# Patient Record
Sex: Female | Born: 1980
Health system: Southern US, Community
[De-identification: ages and names within clinical notes are randomized; demographics above are authoritative.]

## PROBLEM LIST (undated history)

## (undated) ENCOUNTER — Inpatient Hospital Stay (HOSPITAL_COMMUNITY): Payer: Self-pay

## (undated) DIAGNOSIS — Z3493 Encounter for supervision of normal pregnancy, unspecified, third trimester: Secondary | ICD-10-CM

## (undated) DIAGNOSIS — Z8719 Personal history of other diseases of the digestive system: Secondary | ICD-10-CM

## (undated) DIAGNOSIS — N809 Endometriosis, unspecified: Secondary | ICD-10-CM

## (undated) DIAGNOSIS — O039 Complete or unspecified spontaneous abortion without complication: Secondary | ICD-10-CM

## (undated) HISTORY — PX: CHOLECYSTECTOMY: SHX55

## (undated) HISTORY — PX: HERNIA REPAIR: SHX51

## (undated) HISTORY — PX: DILATION AND CURETTAGE OF UTERUS: SHX78

## (undated) HISTORY — PX: WISDOM TOOTH EXTRACTION: SHX21

---

## 2014-08-18 ENCOUNTER — Emergency Department (HOSPITAL_BASED_OUTPATIENT_CLINIC_OR_DEPARTMENT_OTHER): Payer: Medicaid Other

## 2014-08-18 ENCOUNTER — Encounter (HOSPITAL_BASED_OUTPATIENT_CLINIC_OR_DEPARTMENT_OTHER): Payer: Self-pay | Admitting: Emergency Medicine

## 2014-08-18 ENCOUNTER — Emergency Department (HOSPITAL_BASED_OUTPATIENT_CLINIC_OR_DEPARTMENT_OTHER)
Admission: EM | Admit: 2014-08-18 | Discharge: 2014-08-18 | Disposition: A | Discharge status: Home / Self Care | Payer: Medicaid Other | Attending: Emergency Medicine | Admitting: Emergency Medicine

## 2014-08-18 DIAGNOSIS — O039 Complete or unspecified spontaneous abortion without complication: Secondary | ICD-10-CM | POA: Insufficient documentation

## 2014-08-18 DIAGNOSIS — O9989 Other specified diseases and conditions complicating pregnancy, childbirth and the puerperium: Secondary | ICD-10-CM | POA: Insufficient documentation

## 2014-08-18 DIAGNOSIS — R5381 Other malaise: Secondary | ICD-10-CM | POA: Diagnosis not present

## 2014-08-18 DIAGNOSIS — R5383 Other fatigue: Secondary | ICD-10-CM

## 2014-08-18 DIAGNOSIS — R42 Dizziness and giddiness: Secondary | ICD-10-CM | POA: Diagnosis not present

## 2014-08-18 DIAGNOSIS — O209 Hemorrhage in early pregnancy, unspecified: Secondary | ICD-10-CM | POA: Diagnosis present

## 2014-08-18 LAB — CBC
HCT: 37.3 % (ref 36.0–46.0)
Hemoglobin: 12.6 g/dL (ref 12.0–15.0)
MCH: 27.7 pg (ref 26.0–34.0)
MCHC: 33.8 g/dL (ref 30.0–36.0)
MCV: 82 fL (ref 78.0–100.0)
Platelets: 270 10*3/uL (ref 150–400)
RBC: 4.55 MIL/uL (ref 3.87–5.11)
RDW: 14 % (ref 11.5–15.5)
WBC: 9.2 10*3/uL (ref 4.0–10.5)

## 2014-08-18 LAB — HCG, QUANTITATIVE, PREGNANCY: hCG, Beta Chain, Quant, S: 3469 m[IU]/mL — ABNORMAL HIGH (ref <5)

## 2014-08-18 NOTE — Discharge Instructions (Signed)
Miscarriage A miscarriage is the sudden loss of an unborn baby (fetus) before the 20th week of pregnancy. Most miscarriages happen in the first 3 months of pregnancy. Sometimes, it happens before a woman even knows she is pregnant. A miscarriage is also called a "spontaneous miscarriage" or "early pregnancy loss." Having a miscarriage can be an emotional experience. Talk with your caregiver about any questions you may have about miscarrying, the grieving process, and your future pregnancy plans. CAUSES   Problems with the fetal chromosomes that make it impossible for the baby to develop normally. Problems with the baby's genes or chromosomes are most often the result of errors that occur, by chance, as the embryo divides and grows. The problems are not inherited from the parents.  Infection of the cervix or uterus.   Hormone problems.   Problems with the cervix, such as having an incompetent cervix. This is when the tissue in the cervix is not strong enough to hold the pregnancy.   Problems with the uterus, such as an abnormally shaped uterus, uterine fibroids, or congenital abnormalities.   Certain medical conditions.   Smoking, drinking alcohol, or taking illegal drugs.   Trauma.  Often, the cause of a miscarriage is unknown.  SYMPTOMS   Vaginal bleeding or spotting, with or without cramps or pain.  Pain or cramping in the abdomen or lower back.  Passing fluid, tissue, or blood clots from the vagina. DIAGNOSIS  Your caregiver will perform a physical exam. You may also have an ultrasound to confirm the miscarriage. Blood or urine tests may also be ordered. TREATMENT   Sometimes, treatment is not necessary if you naturally pass all the fetal tissue that was in the uterus. If some of the fetus or placenta remains in the body (incomplete miscarriage), tissue left behind may become infected and must be removed. Usually, a dilation and curettage (D and C) procedure is performed.  During a D and C procedure, the cervix is widened (dilated) and any remaining fetal or placental tissue is gently removed from the uterus.  Antibiotic medicines are prescribed if there is an infection. Other medicines may be given to reduce the size of the uterus (contract) if there is a lot of bleeding.  If you have Rh negative blood and your baby was Rh positive, you will need a Rh immunoglobulin shot. This shot will protect any future baby from having Rh blood problems in future pregnancies. HOME CARE INSTRUCTIONS   Your caregiver may order bed rest or may allow you to continue light activity. Resume activity as directed by your caregiver.  Have someone help with home and family responsibilities during this time.   Keep track of the number of sanitary pads you use each day and how soaked (saturated) they are. Write down this information.   Do not use tampons. Do not douche or have sexual intercourse until approved by your caregiver.   Only take over-the-counter or prescription medicines for pain or discomfort as directed by your caregiver.   Do not take aspirin. Aspirin can cause bleeding.   Keep all follow-up appointments with your caregiver.   If you or your partner have problems with grieving, talk to your caregiver or seek counseling to help cope with the pregnancy loss. Allow enough time to grieve before trying to get pregnant again.  SEEK IMMEDIATE MEDICAL CARE IF:   You have severe cramps or pain in your back or abdomen.  You have a fever.  You pass large blood clots (walnut-sized   or larger) ortissue from your vagina. Save any tissue for your caregiver to inspect.   Your bleeding increases.   You have a thick, bad-smelling vaginal discharge.  You become lightheaded, weak, or you faint.   You have chills.  MAKE SURE YOU:  Understand these instructions.  Will watch your condition.  Will get help right away if you are not doing well or get  worse. Document Released: 06/01/2001 Document Revised: 04/02/2013 Document Reviewed: 01/25/2012 [employer] Patient Information 2015 [employer]. This information is not intended to replace advice given to you by your health care provider. Make sure you discuss any questions you have with your health care provider.  

## 2014-08-18 NOTE — ED Notes (Signed)
Reports vaginal bleeding since earlier. Reports some lower abdominal cramping/pain at this time. Reports one pad used since 11am.

## 2014-08-18 NOTE — ED Provider Notes (Signed)
CSN: [id number]     Arrival date & time 08/18/14  1620 History  This chart was scribed for Elwin Mocha, MD, by Yevette Edwards, ED Scribe. This patient was seen in room MH04/MH04 and the patient's care was started at 5:57 PM.   First MD Initiated Contact with Patient 08/18/14 1741     Chief Complaint  Patient presents with  . Vaginal Bleeding    Patient is a 33 y.o. female presenting with vaginal bleeding. The history is provided by the patient. No language interpreter was used.  Vaginal Bleeding Quality:  Clots Severity:  Moderate Onset quality:  Gradual Duration:  1 day Timing:  Intermittent Progression:  Worsening Chronicity:  Recurrent Number of pads used:  One Possible pregnancy: yes   Context: at rest, during urination and spontaneously   Relieved by:  Nothing Worsened by:  Nothing tried Ineffective treatments:  None tried Associated symptoms: abdominal pain and fatigue   Associated symptoms: no fever   Risk factors: prior miscarriage    HPI Comments: Lauren Drake is a 33 y.o. female, who reports Lauren Drake is [redacted] weeks pregnant, presenting to the Emergency Department complaining of intermittent vaginal bleeding which began today.  Her last incidence of vaginal bleeding was approximately four weeks ago; Lauren Drake bled for three days; Lauren Drake was treated in [place]'s ED. Lauren Drake reports the number and size of blood clots Lauren Drake is passing today are greater than when Lauren Drake was treated previously. Lauren Drake also reports that this is the third episode of spotting during the pregancy. Her OBGYN is in [facility]  her appointment is in September.  Lauren Drake also endorses intermittent lower abdominal pain which Lauren Drake characterizes as "like a mild contraction" and back pain. Lauren Drake also reports Lauren Drake experienced lightheadedness and fatigue yesterday; the symptoms have resolved today. This is her fourth pregnancy; Lauren Drake had two lives births but her second pregnancy ended spontaneously at 10 weeks. Lauren Drake reports Lauren Drake is not  RH negative and Lauren Drake denies a h/o Rhogam injection.   History reviewed. No pertinent past medical history. Past Surgical History  Procedure Laterality Date  . Hernia repair     No family history on file. History  Substance Use Topics  . Smoking status: Never Smoker   . Smokeless tobacco: Not on file  . Alcohol Use: No   OB History   Grav Para Term Preterm Abortions TAB SAB Ect Mult Living   1              Review of Systems  Constitutional: Positive for fatigue. Negative for fever.  Gastrointestinal: Positive for abdominal pain.  Genitourinary: Positive for vaginal bleeding.  Neurological: Positive for light-headedness.  All other systems reviewed and are negative.   Allergies  Review of patient's allergies indicates no known allergies.  Home Medications   Prior to Admission medications   Not on File   Triage Vitals: BP 121/78  Pulse 88  Temp(Src) 98.5 F (36.9 C) (Oral)  Resp 18  Ht  (1.626 m)  Wt 175 lb (79.379 kg)  BMI 30.02 kg/m2  SpO2 99%  Physical Exam  Nursing note and vitals reviewed. Constitutional: Lauren Drake appears well-developed and well-nourished. No distress.  HENT:  Head: Normocephalic and atraumatic.  Mouth/Throat: Oropharynx is clear and moist. No oropharyngeal exudate.  Eyes: Conjunctivae and EOM are normal. Pupils are equal, round, and reactive to light. Right eye exhibits no discharge. Left eye exhibits no discharge. No scleral icterus.  Neck: Normal range of motion. Neck supple. No JVD  present. No thyromegaly present.  Cardiovascular: Normal rate, regular rhythm, normal heart sounds and intact distal pulses.  Exam reveals no gallop and no friction rub.   No murmur heard. Pulmonary/Chest: Effort normal and breath sounds normal. No respiratory distress. Lauren Drake has no wheezes. Lauren Drake has no rales.  Abdominal: Soft. Bowel sounds are normal. Lauren Drake exhibits no distension and no mass. There is no tenderness.  Genitourinary: There is bleeding (large, with  multiple clots) around the vagina. No signs of injury around the vagina. No vaginal discharge found.  Large amount of bleeding with clots in her vaginal vault. Unable to assess cervix.  Musculoskeletal: Normal range of motion. Lauren Drake exhibits no edema and no tenderness.  Lymphadenopathy:    Lauren Drake has no cervical adenopathy.  Neurological: Lauren Drake is alert. Coordination normal.  Skin: Skin is warm and dry. No rash noted. No erythema.  Psychiatric: Lauren Drake has a normal mood and affect. Her behavior is normal.    ED Course  Procedures (including critical care time)  DIAGNOSTIC STUDIES: Oxygen Saturation is 99% on room air, normal by my interpretation.    COORDINATION OF CARE:  6:06 PM- Discussed treatment plan with patient, and the patient agreed to the plan. The plan includes lab work and an ultra sound.   Labs Review Labs Reviewed  CBC  HCG, QUANTITATIVE, PREGNANCY  ABO/RH    Imaging Review US Ob Comp Less 14 Wks  08/18/2014   CLINICAL DATA:  Vaginal bleeding, pain.  EXAM: OBSTETRIC <14 WK [place] AND TRANSVAGINAL OB US  TECHNIQUE: Both transabdominal and transvaginal ultrasound examinations were performed for complete evaluation of the gestation as well as the maternal uterus, adnexal regions, and pelvic cul-de-sac. Transvaginal technique was performed to assess early pregnancy.  COMPARISON:  06/27/2014  FINDINGS: Intrauterine gestational sac: Cystic structure noted in the endocervical canal within the cervix. No other suggestion of an intrauterine gestational sac.  Yolk sac:  Questionable  Embryo:  Not visualized  MSD:  22.5  mm   7 w   2  d  Maternal uterus/adnexae: Uterus is retroverted. Probable fibroids within the uterus, the largest posteriorly measuring 3.8 cm. No adnexal masses. No free fluid.  IMPRESSION: Cystic structure noted within the endocervical canal, presumably gestational sac. No fetal pole visualized. This likely reflects abortion in progress.   Electronically Signed   By: Charlett Nose  M.D.   On: 08/18/2014 19:09   US Ob Transvaginal  08/18/2014   CLINICAL DATA:  Vaginal bleeding, pain.  EXAM: OBSTETRIC <14 WK [place] AND TRANSVAGINAL OB US  TECHNIQUE: Both transabdominal and transvaginal ultrasound examinations were performed for complete evaluation of the gestation as well as the maternal uterus, adnexal regions, and pelvic cul-de-sac. Transvaginal technique was performed to assess early pregnancy.  COMPARISON:  06/27/2014  FINDINGS: Intrauterine gestational sac: Cystic structure noted in the endocervical canal within the cervix. No other suggestion of an intrauterine gestational sac.  Yolk sac:  Questionable  Embryo:  Not visualized  MSD:  22.5  mm   7 w   2  d  Maternal uterus/adnexae: Uterus is retroverted. Probable fibroids within the uterus, the largest posteriorly measuring 3.8 cm. No adnexal masses. No free fluid.  IMPRESSION: Cystic structure noted within the endocervical canal, presumably gestational sac. No fetal pole visualized. This likely reflects abortion in progress.   Electronically Signed   By: Charlett Nose M.D.   On: 08/18/2014 19:09     EKG Interpretation None      MDM   Final diagnoses:  Miscarriage    59M here with vaginal bleeding. Mild lower abdominal cramping. [id number]. Patient with persistent worsening bleeding with clots since this morning. Large clots on vaginal exam. US shows nonviable pregnancy with [redacted] week gestational age sac in the cervix in the midst of a miscarriage. Patient counseled, Lauren Drake was thinking this was happening before coming in. Instructed to f/u with OB. Blood type B+, no need for RhoGAM.  I personally performed the services described in this documentation, which was scribed in my presence. The recorded information has been reviewed and is accurate.       Elwin Mocha, MD 08/19/14 [phone]

## 2014-08-19 LAB — ABO/RH: ABO/RH(D): B POS

## 2014-10-22 ENCOUNTER — Encounter (HOSPITAL_BASED_OUTPATIENT_CLINIC_OR_DEPARTMENT_OTHER): Payer: Self-pay | Admitting: Emergency Medicine

## 2015-04-13 ENCOUNTER — Encounter (HOSPITAL_BASED_OUTPATIENT_CLINIC_OR_DEPARTMENT_OTHER): Payer: Self-pay | Admitting: *Deleted

## 2015-04-13 DIAGNOSIS — O99611 Diseases of the digestive system complicating pregnancy, first trimester: Secondary | ICD-10-CM

## 2015-04-13 DIAGNOSIS — A084 Viral intestinal infection, unspecified: Secondary | ICD-10-CM

## 2015-04-13 DIAGNOSIS — O219 Vomiting of pregnancy, unspecified: Secondary | ICD-10-CM | POA: Diagnosis not present

## 2015-04-13 DIAGNOSIS — Z3A13 13 weeks gestation of pregnancy: Secondary | ICD-10-CM | POA: Insufficient documentation

## 2015-04-13 DIAGNOSIS — Z79899 Other long term (current) drug therapy: Secondary | ICD-10-CM | POA: Diagnosis not present

## 2015-04-13 DIAGNOSIS — O210 Mild hyperemesis gravidarum: Secondary | ICD-10-CM

## 2015-04-13 DIAGNOSIS — Z9889 Other specified postprocedural states: Secondary | ICD-10-CM

## 2015-04-13 DIAGNOSIS — R197 Diarrhea, unspecified: Secondary | ICD-10-CM | POA: Diagnosis present

## 2015-04-13 NOTE — ED Notes (Signed)
Pt reports headache x3 days with onset N/V/D today pt  Brought [facility] for same after the both ate leftovers from [employer]. Pt further states she is 13 weeks preg

## 2015-04-14 ENCOUNTER — Encounter (HOSPITAL_BASED_OUTPATIENT_CLINIC_OR_DEPARTMENT_OTHER): Payer: Self-pay | Admitting: Emergency Medicine

## 2015-04-14 ENCOUNTER — Emergency Department (HOSPITAL_BASED_OUTPATIENT_CLINIC_OR_DEPARTMENT_OTHER)
Admission: EM | Admit: 2015-04-14 | Discharge: 2015-04-14 | Disposition: A | Discharge status: Home / Self Care | Payer: Medicaid Other | Source: Home / Self Care | Attending: Emergency Medicine | Admitting: Emergency Medicine

## 2015-04-14 ENCOUNTER — Inpatient Hospital Stay (HOSPITAL_BASED_OUTPATIENT_CLINIC_OR_DEPARTMENT_OTHER)
Admission: EM | Admit: 2015-04-14 | Discharge: 2015-04-15 | Disposition: A | Discharge status: Home / Self Care | Payer: Medicaid Other | Attending: Emergency Medicine | Admitting: Emergency Medicine

## 2015-04-14 ENCOUNTER — Encounter (HOSPITAL_COMMUNITY): Payer: Self-pay | Admitting: *Deleted

## 2015-04-14 DIAGNOSIS — A084 Viral intestinal infection, unspecified: Secondary | ICD-10-CM | POA: Insufficient documentation

## 2015-04-14 DIAGNOSIS — O219 Vomiting of pregnancy, unspecified: Secondary | ICD-10-CM | POA: Diagnosis not present

## 2015-04-14 DIAGNOSIS — O210 Mild hyperemesis gravidarum: Secondary | ICD-10-CM

## 2015-04-14 DIAGNOSIS — R197 Diarrhea, unspecified: Principal | ICD-10-CM

## 2015-04-14 DIAGNOSIS — O99611 Diseases of the digestive system complicating pregnancy, first trimester: Secondary | ICD-10-CM | POA: Insufficient documentation

## 2015-04-14 DIAGNOSIS — Z3A13 13 weeks gestation of pregnancy: Secondary | ICD-10-CM | POA: Insufficient documentation

## 2015-04-14 DIAGNOSIS — R112 Nausea with vomiting, unspecified: Secondary | ICD-10-CM

## 2015-04-14 HISTORY — DX: Complete or unspecified spontaneous abortion without complication: O03.9

## 2015-04-14 HISTORY — DX: Personal history of other diseases of the digestive system: Z87.19

## 2015-04-14 LAB — BASIC METABOLIC PANEL
Anion gap: 8 (ref 5–15)
BUN: 12 mg/dL (ref 6–23)
CO2: 22 mmol/L (ref 19–32)
Calcium: 8.5 mg/dL (ref 8.4–10.5)
Chloride: 104 mmol/L (ref 96–112)
Creatinine, Ser: 0.54 mg/dL (ref 0.50–1.10)
GFR calc Af Amer: >90 mL/min (ref >90)
GFR calc non Af Amer: >90 mL/min (ref >90)
Glucose, Bld: 99 mg/dL (ref 70–99)
Potassium: 3.4 mmol/L — ABNORMAL LOW (ref 3.5–5.1)
Sodium: 134 mmol/L — ABNORMAL LOW (ref 135–145)

## 2015-04-14 LAB — URINALYSIS, ROUTINE W REFLEX MICROSCOPIC
Bilirubin Urine: NEGATIVE
Bilirubin Urine: NEGATIVE
Glucose, UA: NEGATIVE mg/dL
Glucose, UA: NEGATIVE mg/dL
Ketones, ur: >80 mg/dL — AB
Ketones, ur: >80 mg/dL — AB
Leukocytes, UA: NEGATIVE
Leukocytes, UA: NEGATIVE
Nitrite: NEGATIVE
Nitrite: NEGATIVE
Protein, ur: NEGATIVE mg/dL
Protein, ur: NEGATIVE mg/dL
Specific Gravity, Urine: 1.025 (ref 1.005–1.030)
Specific Gravity, Urine: >1.03 — ABNORMAL HIGH (ref 1.005–1.030)
Urobilinogen, UA: 1 mg/dL (ref 0.0–1.0)
Urobilinogen, UA: 2 mg/dL — ABNORMAL HIGH (ref 0.0–1.0)
pH: 6 (ref 5.0–8.0)
pH: 6 (ref 5.0–8.0)

## 2015-04-14 LAB — URINE MICROSCOPIC-ADD ON

## 2015-04-14 LAB — POCT PREGNANCY, URINE: Preg Test, Ur: POSITIVE — AB

## 2015-04-14 MED ORDER — DEXTROSE 5 % IN LACTATED RINGERS IV BOLUS
1000.0000 mL | Freq: Once | INTRAVENOUS | Status: AC
  Administered 2015-04-14: 1000 mL via INTRAVENOUS

## 2015-04-14 MED ORDER — SODIUM CHLORIDE 0.9 % IV BOLUS (SEPSIS)
1000.0000 mL | Freq: Once | INTRAVENOUS | Status: AC
  Administered 2015-04-14: 1000 mL via INTRAVENOUS

## 2015-04-14 MED ORDER — ONDANSETRON 8 MG PO TBDP
8.0000 mg | ORAL_TABLET | ORAL | Status: AC
  Administered 2015-04-14: 8 mg via ORAL
  Filled 2015-04-14: qty 1

## 2015-04-14 MED ORDER — LACTATED RINGERS IV BOLUS (SEPSIS): 1000.0000 mL | Freq: Once | INTRAVENOUS | Status: DC

## 2015-04-14 MED ORDER — PROMETHAZINE HCL 25 MG PO TABS: 12.5000 mg | ORAL_TABLET | Freq: Four times a day (QID) | ORAL | Status: AC | PRN

## 2015-04-14 MED ORDER — DOXYLAMINE-PYRIDOXINE 10-10 MG PO TBEC: 1.0000 | DELAYED_RELEASE_TABLET | Freq: Two times a day (BID) | ORAL | Status: AC

## 2015-04-14 MED ORDER — ACETAMINOPHEN 500 MG PO TABS
1000.0000 mg | ORAL_TABLET | Freq: Once | ORAL | Status: AC
  Administered 2015-04-14: 1000 mg via ORAL
  Filled 2015-04-14: qty 2

## 2015-04-14 NOTE — ED Notes (Signed)
Tolerated sips of fluids. States she feels a little nauseated but denies vomiting. MD aware

## 2015-04-14 NOTE — MAU Provider Note (Signed)
  History    CSN: [id number]  Arrival date and time: 04/14/15 2138   None     No chief complaint on file.  HPI Comments:  G1 at 13 wks in with c/o nausea and vomiting x 4 days states saw OB in [place] and was given dicilgus but has not taken yet.  Emesis  This is a new problem. The current episode started in the past 7 days. The problem occurs 2 to 4 times per day. The problem has been unchanged. The emesis has an appearance of bile. There has been no fever. Associated symptoms include diarrhea. Pertinent negatives include no abdominal pain, chills, dizziness or fever. Risk factors include ill contacts. She has tried nothing for the symptoms.    OB History    Gravida Para Term Preterm AB TAB SAB Ectopic Multiple Living   1               Past Medical History  Diagnosis Date  . Miscarriage     Past Surgical History  Procedure Laterality Date  . Hernia repair    . Wisdom tooth extraction Bilateral     No family history on file.  History  Substance Use Topics  . Smoking status: Never Smoker   . Smokeless tobacco: Not on file  . Alcohol Use: No    Allergies: No Known Allergies  Prescriptions prior to admission  Medication Sig Dispense Refill Last Dose  . Doxylamine-Pyridoxine 10-10 MG TBEC Take 1 tablet by mouth 2 times daily at 12 noon and 4 pm. 12 tablet 0     Review of Systems  Constitutional: Negative.  Negative for fever and chills.  HENT: Negative.   Eyes: Negative.   Respiratory: Negative.   Cardiovascular: Negative.   Gastrointestinal: Positive for nausea, vomiting and diarrhea. Negative for abdominal pain and constipation.  Genitourinary: Negative.  Negative for dysuria, urgency and frequency.  Musculoskeletal: Negative.   Skin: Negative.   Neurological: Negative.  Negative for dizziness.  Endo/Heme/Allergies: Negative.   Psychiatric/Behavioral: Negative.    Physical Exam   Last menstrual period 01/06/2015.  Physical Exam  Constitutional: She  is oriented to person, place, and time. She appears well-developed and well-nourished.  HENT:  Head: Normocephalic.  Eyes: Pupils are equal, round, and reactive to light.  Neck: Normal range of motion.  Cardiovascular: Normal rate, regular rhythm, normal heart sounds and intact distal pulses.   Respiratory: Effort normal and breath sounds normal.  GI: Soft. Bowel sounds are normal.  Genitourinary: Vagina normal.  Musculoskeletal: Normal range of motion.  Neurological: She is alert and oriented to person, place, and time. She has normal reflexes.  Skin: Skin is warm and dry.  Psychiatric: She has a normal mood and affect. Her behavior is normal. Judgment and thought content normal.    MAU Course  Procedures  MDM Nausea and vomiting of pregnancy  Assessment and Plan  Nausea and vomiting of pregnancy. Will give zofran 8 mg ODT and and start on po's if retains will d/c home U/A from earlier today shows greater than 80 ketones. Will IV hydrate.  1. Nausea/vomiting in pregnancy   2. Viral gastroenteritis    DC home RX phenergan #30 Return to MAU as needed FU with OB provider as planned  Tawnya CrookHogan, Madiha Bambrick Donovan 11:53 PM 04/14/2015    Drake, Lauren DARLENE 04/14/2015, 9:48 PM

## 2015-04-14 NOTE — ED Provider Notes (Signed)
CSN: [id number]     Arrival date & time 04/13/15  2319 History   First MD Initiated Contact with Patient 04/14/15 0241     Chief Complaint  Patient presents with  . Emesis  . Diarrhea     (Consider location/radiation/quality/duration/timing/severity/associated sxs/prior Treatment) Patient is a 34 y.o. female presenting with vomiting and diarrhea. The history is provided by the patient.  Emesis Severity:  Moderate Timing:  Intermittent Quality:  Stomach contents Progression:  Unchanged Chronicity:  New Recent urination:  Normal Context: not post-tussive   Relieved by:  Nothing Ineffective treatments:  None tried Associated symptoms: diarrhea and URI   Risk factors: sick contacts   Risk factors comment:  Mom is here with same Diarrhea Associated symptoms: URI and vomiting   Associated symptoms: no fever     Past Medical History  Diagnosis Date  . Miscarriage    Past Surgical History  Procedure Laterality Date  . Hernia repair    . Wisdom tooth extraction Bilateral    History reviewed. No pertinent family history. History  Substance Use Topics  . Smoking status: Never Smoker   . Smokeless tobacco: Not on file  . Alcohol Use: No   OB History    Gravida Para Term Preterm AB TAB SAB Ectopic Multiple Living   1              Review of Systems  Constitutional: Negative for fever.  Cardiovascular: Negative for chest pain, palpitations and leg swelling.  Gastrointestinal: Positive for vomiting and diarrhea.  All other systems reviewed and are negative.     Allergies  Review of patient's allergies indicates no known allergies.  Home Medications   Prior to Admission medications   Medication Sig Start Date End Date Taking? Authorizing Provider  Doxylamine-Pyridoxine 10-10 MG TBEC Take 1 tablet by mouth 2 times daily at 12 noon and 4 pm. 04/14/15   Cortez Steelman, MD   BP 124/74 mmHg  Pulse 104  Temp(Src) 98.4 F (36.9 C) (Oral)  Resp 18  SpO2 99% Physical  Exam  Constitutional: She is oriented to person, place, and time. She appears well-developed and well-nourished. No distress.  HENT:  Head: Normocephalic and atraumatic.  Mouth/Throat: Oropharynx is clear and moist.  Eyes: Conjunctivae are normal. Pupils are equal, round, and reactive to light.  Neck: Normal range of motion. Neck supple.  Cardiovascular: Normal rate, regular rhythm and intact distal pulses.   Pulmonary/Chest: Effort normal and breath sounds normal. No respiratory distress. She has no wheezes. She has no rales.  Abdominal: Soft. Bowel sounds are normal. There is no tenderness. There is no rebound and no guarding.  Musculoskeletal: Normal range of motion.  Neurological: She is alert and oriented to person, place, and time.  Skin: Skin is warm and dry.  Psychiatric: She has a normal mood and affect.    ED Course  Procedures (including critical care time) Labs Review Labs Reviewed  URINALYSIS, ROUTINE W REFLEX MICROSCOPIC - Abnormal; Notable for the following:    Hgb urine dipstick MODERATE (*)    Ketones, ur >80 (*)    All other components within normal limits  BASIC METABOLIC PANEL - Abnormal; Notable for the following:    Sodium 134 (*)    Potassium 3.4 (*)    All other components within normal limits  URINE MICROSCOPIC-ADD ON - Abnormal; Notable for the following:    Squamous Epithelial / LPF FEW (*)    Bacteria, UA FEW (*)    All other  components within normal limits    Imaging Review No results found.   EKG Interpretation None      MDM   Final diagnoses:  Nausea vomiting and diarrhea    No vomiting in the ED will prescribe diclegis.  No NSAIDS (aspirin, ibuprofen or naproxen) follow up with your GYN    Brelynn Wheller, MD 04/14/15 [phone]

## 2015-04-14 NOTE — Discharge Instructions (Signed)
Food Choices to Help Relieve Diarrhea When you have diarrhea, the foods you eat and your eating habits are very important. Choosing the right foods and drinks can help relieve diarrhea. Also, because diarrhea can last up to 7 days, you need to replace lost fluids and electrolytes (such as sodium, potassium, and chloride) in order to help prevent dehydration.  WHAT GENERAL GUIDELINES DO I NEED TO FOLLOW?  Slowly drink 1 cup (8 oz) of fluid for each episode of diarrhea. If you are getting enough fluid, your urine will be clear or pale yellow.  Eat starchy foods. Some good choices include white rice, white toast, pasta, low-fiber cereal, baked potatoes (without the skin), saltine crackers, and bagels.  Avoid large servings of any cooked vegetables.  Limit fruit to two servings per day. A serving is  cup or 1 small piece.  Choose foods with less than 2 g of fiber per serving.  Limit fats to less than 8 tsp (38 g) per day.  Avoid fried foods.  Eat foods that have probiotics in them. Probiotics can be found in certain dairy products.  Avoid foods and beverages that may increase the speed at which food moves through the stomach and intestines (gastrointestinal tract). Things to avoid include:  High-fiber foods, such as dried fruit, raw fruits and vegetables, nuts, seeds, and whole grain foods.  Spicy foods and high-fat foods.  Foods and beverages sweetened with high-fructose corn syrup, honey, or sugar alcohols such as xylitol, sorbitol, and mannitol. WHAT FOODS ARE RECOMMENDED? Grains White rice. White, [place], or pita breads (fresh or toasted), including plain rolls, buns, or bagels. White pasta. Saltine, soda, or graham crackers. Pretzels. Low-fiber cereal. Cooked cereals made with water (such as cornmeal, farina, or cream cereals). Plain muffins. Matzo. Melba toast. Zwieback.  Vegetables Potatoes (without the skin). Strained tomato and vegetable juices. Most well-cooked and canned  vegetables without seeds. Tender lettuce. Fruits Cooked or canned applesauce, apricots, cherries, fruit cocktail, grapefruit, peaches, pears, or plums. Fresh bananas, apples without skin, cherries, grapes, cantaloupe, grapefruit, peaches, oranges, or plums.  Meat and Other Protein Products Baked or boiled chicken. Eggs. Tofu. Fish. Seafood. Smooth peanut butter. Ground or well-cooked tender beef, ham, veal, lamb, pork, or poultry.  Dairy Plain yogurt, kefir, and unsweetened liquid yogurt. Lactose-free milk, buttermilk, or soy milk. Plain hard cheese. Beverages Sport drinks. Clear broths. Diluted fruit juices (except prune). Regular, caffeine-free sodas such as ginger ale. Water. Decaffeinated teas. Oral rehydration solutions. Sugar-free beverages not sweetened with sugar alcohols. Other Bouillon, broth, or soups made from recommended foods.  The items listed above may not be a complete list of recommended foods or beverages. Contact your dietitian for more options. WHAT FOODS ARE NOT RECOMMENDED? Grains Whole grain, whole wheat, bran, or rye breads, rolls, pastas, crackers, and cereals. Wild or brown rice. Cereals that contain more than 2 g of fiber per serving. Corn tortillas or taco shells. Cooked or dry oatmeal. Granola. Popcorn. Vegetables Raw vegetables. Cabbage, broccoli, Brussels sprouts, artichokes, baked beans, beet greens, corn, kale, legumes, peas, sweet potatoes, and yams. Potato skins. Cooked spinach and cabbage. Fruits Dried fruit, including raisins and dates. Raw fruits. Stewed or dried prunes. Fresh apples with skin, apricots, mangoes, pears, raspberries, and strawberries.  Meat and Other Protein Products Chunky peanut butter. Nuts and seeds. Beans and lentils. Tomasa BlaseBacon.  Dairy High-fat cheeses. Milk, chocolate milk, and beverages made with milk, such as milk shakes. Cream. Ice cream. Sweets and Desserts Sweet rolls, doughnuts, and sweet breads. Pancakes  and waffles. Fats and  Oils Butter. Cream sauces. Margarine. Salad oils. Plain salad dressings. Olives. Avocados.  Beverages Caffeinated beverages (such as coffee, tea, soda, or energy drinks). Alcoholic beverages. Fruit juices with pulp. Prune juice. Soft drinks sweetened with high-fructose corn syrup or sugar alcohols. Other Coconut. Hot sauce. Chili powder. Mayonnaise. Gravy. Cream-based or milk-based soups.  The items listed above may not be a complete list of foods and beverages to avoid. Contact your dietitian for more information. WHAT SHOULD I DO IF I BECOME DEHYDRATED? Diarrhea can sometimes lead to dehydration. Signs of dehydration include dark urine and dry mouth and skin. If you think you are dehydrated, you should rehydrate with an oral rehydration solution. These solutions can be purchased at pharmacies, retail stores, or online.  Drink -1 cup (120-240 mL) of oral rehydration solution each time you have an episode of diarrhea. If drinking this amount makes your diarrhea worse, try drinking smaller amounts more often. For example, drink 1-3 tsp (5-15 mL) every 5-10 minutes.  A general rule for staying hydrated is to drink 1-2 L of fluid per day. Talk to your health care provider about the specific amount you should be drinking each day. Drink enough fluids to keep your urine clear or pale yellow. Document Released: 02/26/2004 Document Revised: 12/11/2013 Document Reviewed: 10/29/2013 [facility] Patient Information 2015 [employer], [place]. This information is not intended to replace advice given to you by your health care provider. Make sure you discuss any questions you have with your health care provider. Morning Sickness Morning sickness is when you feel sick to your stomach (nauseous) during pregnancy. This nauseous feeling may or may not come with vomiting. It often occurs in the morning but can be a problem any time of day. Morning sickness is most common during the first trimester, but it may continue  throughout pregnancy. While morning sickness is unpleasant, it is usually harmless unless you develop severe and continual vomiting (hyperemesis gravidarum). This condition requires more intense treatment.  CAUSES  The cause of morning sickness is not completely known but seems to be related to normal hormonal changes that occur in pregnancy. RISK FACTORS You are at greater risk if you:  Experienced nausea or vomiting before your pregnancy.  Had morning sickness during a previous pregnancy.  Are pregnant with more than one baby, such as twins. TREATMENT  Do not use any medicines (prescription, over-the-counter, or herbal) for morning sickness without first talking to your health care provider. Your health care provider may prescribe or recommend:  Vitamin B6 supplements.  Anti-nausea medicines.  The herbal medicine ginger. HOME CARE INSTRUCTIONS   Only take over-the-counter or prescription medicines as directed by your health care provider.  Taking multivitamins before getting pregnant can prevent or decrease the severity of morning sickness in most women.  Eat a piece of dry toast or unsalted crackers before getting out of bed in the morning.  Eat five or six small meals a day.  Eat dry and bland foods (rice, baked potato). Foods high in carbohydrates are often helpful.  Do not drink liquids with your meals. Drink liquids between meals.  Avoid greasy, fatty, and spicy foods.  Get someone to cook for you if the smell of any food causes nausea and vomiting.  If you feel nauseous after taking prenatal vitamins, take the vitamins at night or with a snack.  Snack on protein foods (nuts, yogurt, cheese) between meals if you are hungry.  Eat unsweetened gelatins for desserts.  Wearing an  acupressure wristband (worn for sea sickness) may be helpful.  Acupuncture may be helpful.  Do not smoke.  Get a humidifier to keep the air in your house free of odors.  Get plenty of  fresh air. SEEK MEDICAL CARE IF:   Your home remedies are not working, and you need medicine.  You feel dizzy or lightheaded.  You are losing weight. SEEK IMMEDIATE MEDICAL CARE IF:   You have persistent and uncontrolled nausea and vomiting.  You pass out (faint). MAKE SURE YOU:  Understand these instructions.  Will watch your condition.  Will get help right away if you are not doing well or get worse. Document Released: 01/27/2007 Document Revised: 12/11/2013 Document Reviewed: 05/23/2013 [facility] Patient Information 2015 [place] Center[employer]Care, [place]. This information is not intended to replace advice given to you by your health care provider. Make sure you discuss any questions you have with your health care provider.

## 2015-04-16 LAB — URINE CULTURE: Colony Count: 6000

## 2015-09-30 ENCOUNTER — Encounter (HOSPITAL_BASED_OUTPATIENT_CLINIC_OR_DEPARTMENT_OTHER): Payer: Self-pay | Admitting: Emergency Medicine

## 2015-09-30 ENCOUNTER — Emergency Department (HOSPITAL_BASED_OUTPATIENT_CLINIC_OR_DEPARTMENT_OTHER)
Admission: EM | Admit: 2015-09-30 | Discharge: 2015-09-30 | Disposition: A | Discharge status: Other Acute Inpatient Hospital | Payer: Medicaid Other | Attending: Emergency Medicine | Admitting: Emergency Medicine

## 2015-09-30 DIAGNOSIS — Z3A38 38 weeks gestation of pregnancy: Secondary | ICD-10-CM | POA: Diagnosis not present

## 2015-09-30 DIAGNOSIS — O322XX0 Maternal care for transverse and oblique lie, not applicable or unspecified: Secondary | ICD-10-CM | POA: Insufficient documentation

## 2015-09-30 DIAGNOSIS — Z8719 Personal history of other diseases of the digestive system: Secondary | ICD-10-CM | POA: Insufficient documentation

## 2015-09-30 DIAGNOSIS — O471 False labor at or after 37 completed weeks of gestation: Secondary | ICD-10-CM | POA: Insufficient documentation

## 2015-09-30 HISTORY — DX: Encounter for supervision of normal pregnancy, unspecified, third trimester: Z34.93

## 2015-09-30 MED ORDER — ONDANSETRON HCL 4 MG/2ML IJ SOLN
4.0000 mg | Freq: Once | INTRAMUSCULAR | Status: AC
  Administered 2015-09-30: 4 mg via INTRAVENOUS
  Filled 2015-09-30: qty 2

## 2015-09-30 NOTE — ED Notes (Addendum)
O2 at 2l/m [place] applied. Pt family member back to visit with pt.

## 2015-09-30 NOTE — ED Notes (Signed)
Leaking fluid since yesterday. Pt states scheduled to have c-section on Thursday at [facility]. Pt starting having contractions this am and continues to hurt. Pt states baby is transverse.

## 2015-09-30 NOTE — ED Notes (Signed)
[facility] IS COMING TO TRANSFER THE PATIENT [facility])

## 2015-09-30 NOTE — ED Notes (Signed)
Care turned over to [facility]. PT is stable condition.

## 2015-09-30 NOTE — ED Notes (Signed)
FHR is 148.

## 2015-09-30 NOTE — ED Notes (Signed)
FHR is 148. 

## 2015-09-30 NOTE — ED Notes (Signed)
[facility] here to transfer pt to L & D at [facility]. Report given to  [facility] EMT-P with [facility].

## 2015-09-30 NOTE — ED Provider Notes (Signed)
CSN: [id number]     Arrival date & time 09/30/15  1808 History   First MD Initiated Contact with Patient 09/30/15 1815     No chief complaint on file.    (Consider location/radiation/quality/duration/timing/severity/associated sxs/prior Treatment) HPI Comments: Patient is a 34 year old female G4 P3003 at approximately [redacted] weeks gestation. She presents for evaluation of leaking fluid for the past 2 days and is now having abdominal contractions. She was seen by her GYN several days ago and told that she had a transverse lie and a planned C-section is scheduled for 2 days from now. Her contractions became worse this evening and she presents for evaluation of this.  The history is provided by the patient.    Past Medical History  Diagnosis Date  . Miscarriage   . History of hiatal hernia    Past Surgical History  Procedure Laterality Date  . Hernia repair    . Wisdom tooth extraction Bilateral   . Dilation and curettage of uterus     No family history on file. Social History  Substance Use Topics  . Smoking status: Never Smoker   . Smokeless tobacco: Not on file  . Alcohol Use: No   OB History    Gravida Para Term Preterm AB TAB SAB Ectopic Multiple Living   Review of Systems  All other systems reviewed and are negative.     Allergies  Review of patient's allergies indicates no known allergies.  Home Medications   Prior to Admission medications   Medication Sig Start Date End Date Taking? Authorizing Provider  Doxylamine-Pyridoxine 10-10 MG TBEC Take 1 tablet by mouth 2 times daily at 12 noon and 4 pm. Patient not taking: Reported on 04/14/2015 04/14/15   April Palumbo, MD  Prenatal Vit-Fe Fumarate-FA (PRENATAL MULTIVITAMIN) TABS tablet Take 1 tablet by mouth daily at 12 noon.    Historical Provider, MD  promethazine (PHENERGAN) 25 MG tablet Take 0.5-1 tablets (12.5-25 mg total) by mouth every 6 (six) hours as needed. 04/14/15   Armando Reichert, CNM    LMP 01/06/2015 Physical Exam  Constitutional: She is oriented to person, place, and time. She appears well-developed and well-nourished. No distress.  HENT:  Head: Normocephalic and atraumatic.  Neck: Normal range of motion. Neck supple.  Cardiovascular: Normal rate and regular rhythm.  Exam reveals no gallop and no friction rub.   No murmur heard. Pulmonary/Chest: Effort normal and breath sounds normal. No respiratory distress. She has no wheezes.  Abdominal: Soft. Bowel sounds are normal. She exhibits distension. There is no tenderness.  There is a gravid uterus consistent with stated gestational age.  Genitourinary:  The cervix is closed. There is minimal effacement. Station is high.  Musculoskeletal: Normal range of motion.  Neurological: She is alert and oriented to person, place, and time.  Skin: Skin is warm and dry. She is not diaphoretic.  Nursing note and vitals reviewed.   ED Course  Procedures (including critical care time) Labs Review Labs Reviewed - No data to display  Imaging Review No results found. I have personally reviewed and evaluated these images and lab results as part of my medical decision-making.   EKG Interpretation None      MDM   Final diagnoses:  None    Patient is a G4 at [redacted] weeks gestation who presents with abdominal contractions and leakage of fluid. She was told that she has a transverse lie and  is scheduled for an elective C-section on Thursday. Her cervical exam reveals no palpable presenting part or cervical dilation. I have monitored her on the fetal monitor. She has had contractions while she has been here, however there is no sign of any fetal distress. I've discussed the case with Dr. Altamese Wellington who is on-call for OB/GYN at [facility]. He agrees to accept her to the labor and delivery. She will be transferred there for further workup.    Geoffery Lyons, MD 09/30/15 [phone]

## 2015-09-30 NOTE — ED Notes (Addendum)
Pt placed on left side. Toco monitor attached and Terri RN called to talk with rapid response RN. FHR is 151.

## 2015-09-30 NOTE — Progress Notes (Signed)
Lauren Drake with [facility] contacted by Teri,ED-RN about pt at 43 1/7, G4 P2, who has complaints of vomiting, passing out, leaking of fluid, and contracting. Pt is scheduled for a C/S in 2 days due to a transverse lie, pt states that she takes extra iron due to low iron levels.  Pt says that the "leaking of fluid" has been since last night as well as the contractions. She has only needed to use tissues for the leaking of fluid, not enough for a pad. Pt reports last intercourse was over a week ago. Pt appears comfortable at this time via ED RN and audibly by [facility] (over phone). SVE by ED provider showed a closed cervix; blood has been drawn for lab work, Normal Saline bolusing. [facility] spoke with ED provider about plan of care; He plans to consult with her OB provider in [facility]; [facility] told provider that fhr tracing is reactive and reassuring, have seen one contraction. ED will be in contact with [facility] to update on plan of care; [facility] will call if any concerns about the fhr.

## 2015-09-30 NOTE — ED Notes (Signed)
Pt states she passed out at home today after vomiting and got dizzy. Pt states son and Vanetta Shawl brought her to the closet ED.

## 2015-10-06 LAB — CBG MONITORING, ED: Glucose-Capillary: 100 mg/dL — ABNORMAL HIGH (ref 65–99)

## 2016-02-17 ENCOUNTER — Encounter (HOSPITAL_COMMUNITY): Payer: Self-pay | Admitting: *Deleted

## 2016-03-27 ENCOUNTER — Emergency Department (HOSPITAL_BASED_OUTPATIENT_CLINIC_OR_DEPARTMENT_OTHER)
Admission: EM | Admit: 2016-03-27 | Discharge: 2016-03-28 | Disposition: A | Discharge status: Home / Self Care | Payer: Medicaid Other | Attending: Emergency Medicine | Admitting: Emergency Medicine

## 2016-03-27 ENCOUNTER — Encounter (HOSPITAL_BASED_OUTPATIENT_CLINIC_OR_DEPARTMENT_OTHER): Payer: Self-pay | Admitting: *Deleted

## 2016-03-27 DIAGNOSIS — O99321 Drug use complicating pregnancy, first trimester: Secondary | ICD-10-CM | POA: Insufficient documentation

## 2016-03-27 DIAGNOSIS — T50905A Adverse effect of unspecified drugs, medicaments and biological substances, initial encounter: Secondary | ICD-10-CM

## 2016-03-27 DIAGNOSIS — L509 Urticaria, unspecified: Secondary | ICD-10-CM | POA: Diagnosis not present

## 2016-03-27 DIAGNOSIS — Z3A01 Less than 8 weeks gestation of pregnancy: Secondary | ICD-10-CM | POA: Diagnosis not present

## 2016-03-27 DIAGNOSIS — Z79899 Other long term (current) drug therapy: Secondary | ICD-10-CM | POA: Insufficient documentation

## 2016-03-27 DIAGNOSIS — F1590 Other stimulant use, unspecified, uncomplicated: Secondary | ICD-10-CM | POA: Insufficient documentation

## 2016-03-27 DIAGNOSIS — O24419 Gestational diabetes mellitus in pregnancy, unspecified control: Secondary | ICD-10-CM | POA: Insufficient documentation

## 2016-03-27 MED ORDER — PREDNISONE 20 MG PO TABS: 40.0000 mg | ORAL_TABLET | Freq: Every day | ORAL | Status: AC

## 2016-03-27 MED ORDER — DIPHENHYDRAMINE HCL 25 MG PO CAPS
25.0000 mg | ORAL_CAPSULE | Freq: Once | ORAL | Status: AC
Start: 2016-03-27 — End: 2016-03-27
  Administered 2016-03-27: 25 mg via ORAL
  Filled 2016-03-27: qty 1

## 2016-03-27 MED ORDER — DIPHENHYDRAMINE HCL 25 MG PO CAPS
25.0000 mg | ORAL_CAPSULE | Freq: Once | ORAL | Status: AC
  Administered 2016-03-27: 25 mg via ORAL
  Filled 2016-03-27: qty 1

## 2016-03-27 MED ORDER — RANITIDINE HCL 150 MG PO CAPS: 150.0000 mg | ORAL_CAPSULE | Freq: Every day | ORAL | Status: AC

## 2016-03-27 MED ORDER — FAMOTIDINE 20 MG PO TABS
20.0000 mg | ORAL_TABLET | Freq: Once | ORAL | Status: AC
  Administered 2016-03-27: 20 mg via ORAL
  Filled 2016-03-27: qty 1

## 2016-03-27 MED ORDER — DIPHENHYDRAMINE HCL 25 MG PO TABS: 25.0000 mg | ORAL_TABLET | Freq: Four times a day (QID) | ORAL | Status: AC | PRN

## 2016-03-27 MED ORDER — FLUCONAZOLE 50 MG PO TABS
150.0000 mg | ORAL_TABLET | Freq: Once | ORAL | Status: AC
  Administered 2016-03-28: 150 mg via ORAL
  Filled 2016-03-27: qty 1

## 2016-03-27 MED ORDER — PREDNISONE 10 MG PO TABS
60.0000 mg | ORAL_TABLET | Freq: Once | ORAL | Status: AC
  Administered 2016-03-27: 60 mg via ORAL
  Filled 2016-03-27: qty 1

## 2016-03-27 NOTE — Discharge Instructions (Signed)
Drug Allergy °Allergic reactions to medicines are common. Some allergic reactions are mild. A delayed type of drug allergy that occurs 1 week or more after exposure to a medicine or vaccine is called serum sickness. A life-threatening, sudden (acute) allergic reaction that involves the whole body is called anaphylaxis. °CAUSES  °"True" drug allergies occur when there is an allergic reaction to a medicine. This is caused by overactivity of the immune system. First, the body becomes sensitized. The immune system is triggered by your first exposure to the medicine. Following this first exposure, future exposure to the same medicine may be life-threatening. °Almost any medicine can cause an allergic reaction. Common ones are: °· Penicillin. °· Sulfonamides (sulfa drugs). °· Local anesthetics. °· X-ray dyes that contain iodine. °SYMPTOMS  °Common symptoms of a minor allergic reaction are: °· Swelling around the mouth. °· An itchy red rash or hives. °· Vomiting or diarrhea. °Anaphylaxis can cause swelling of the mouth and throat. This makes it difficult to breathe and swallow. Severe reactions can be fatal within seconds, even after exposure to only a trace amount of the drug that causes the reaction. °HOME CARE INSTRUCTIONS °· If you are unsure of what caused your reaction, write down: °¨ The names of the medicines you took. °¨ How much medicine you took. °¨ How you took the medicine, such as whether you took a pill, injected the medicine, or applied it to your skin. °¨ All of the things you ate and drank. °¨ The date and time of your reaction. °¨ The symptoms of the reaction. °· You may want to follow up with an allergy specialist after the reaction has cleared in order to be tested to confirm the allergy. It is important to confirm that your reaction is an allergy, not just a side effect to the medicine. If you have a true allergy to a medicine, this may prevent that medicine and related medicines from being given to  you when you are very ill. °· If you have hives or a rash: °¨ Take medicines as directed by your caregiver. °¨ You may use an over-the-counter antihistamine (diphenhydramine) as needed. °¨ Apply cold compresses to the skin or take baths in cool water. Avoid hot baths or showers. °· If you are severely allergic: °¨ Continuous observation after a severe reaction may be needed. Hospitalization is often required. °¨ Wear a medical alert bracelet or necklace stating your allergy. °¨ You and your family must learn how to use an anaphylaxis kit or give an epinephrine injection to temporarily treat an emergency allergic reaction. If you have had a severe reaction, always carry your epinephrine injection or anaphylaxis kit with you. This can be lifesaving if you have a severe reaction. °· Do not drive or perform tasks after treatment until the medicines used to treat your reaction have worn off, or until your caregiver says it is okay. °· If you have a drug allergy that was confirmed by your health care provider: °¨ Carry information about the drug allergy with you at all times. °¨ Always check with a pharmacist before taking any over-the-counter medicine. °SEEK MEDICAL CARE IF:  °· You think you had an allergic reaction. Symptoms usually start within 30 minutes after exposure. °· Symptoms are getting worse rather than better. °· You develop new symptoms. °· The symptoms that brought you to your caregiver return. °SEEK IMMEDIATE MEDICAL CARE IF:  °· You have swelling of the mouth, difficulty breathing, or wheezing. °· You have a tight   feeling in your chest or throat.  You develop hives, swelling, or itching all over your body.  You develop severe vomiting or diarrhea.  You feel faint or pass out. This is an emergency. Use your epinephrine injection or anaphylaxis kit as you have been instructed. Call for emergency medical help. Even if you improve after the injection, you need to be examined at a hospital emergency  department. MAKE SURE YOU:   Understand these instructions.  Will watch your condition.  Will get help right away if you are not doing well or get worse.   This information is not intended to replace advice given to you by your health care provider. Make sure you discuss any questions you have with your health care provider.   Document Released: 12/06/2005 Document Revised: 12/27/2014 Document Reviewed: 07/08/2015 Elsevier Interactive Patient Education [employer].

## 2016-03-27 NOTE — ED Provider Notes (Signed)
CSN: [id number]     Arrival date & time 03/27/16  2031 History   First MD Initiated Contact with Patient 03/27/16 2137     Chief Complaint  Patient presents with  . Allergic Reaction    Lauren Drake is a 35 y.o. female who presents to the ED Complaining of itching to her body as well as an itchy rash above her right eye. The patient reports she was having some vaginal itching and symptoms of a yeast infection and called her OB/GYN office who called her and Flagyl. The patient reports she's had no vaginal bleeding or vaginal discharge. She reports she has had yeast infections previously and is tolerating Diflucan well. She has never taken Flagyl for a yeast infection. She reports she took one dose of this and had some itching all over her body. She reports she took a second dose around 7 PM tonight and began having more itching as well as a rash above her right eye. She denies any new soaps, lotions, perfumes or detergents. No new plants or animals in the home. She has taken nothing for treatment of her symptoms today. No known allergy to Flagyl. She denies fevers, tongue swelling, lip swelling, throat swelling, trouble breathing, chest pain, abdominal pain, nausea, vomiting, diarrhea, dysuria, vaginal bleeding, vaginal discharge, or syncope.  The history is provided by the patient. No language interpreter was used.    Past Medical History  Diagnosis Date  . Miscarriage   . History of hiatal hernia   . Pregnant and not yet delivered in third trimester   . Gestational diabetes    Past Surgical History  Procedure Laterality Date  . Hernia repair    . Wisdom tooth extraction Bilateral   . Dilation and curettage of uterus     History reviewed. No pertinent family history. Social History  Substance Use Topics  . Smoking status: Never Smoker   . Smokeless tobacco: None  . Alcohol Use: No   OB History    Gravida Para Term Preterm AB TAB SAB Ectopic Multiple Living   Review of Systems  Constitutional: Negative for fever and chills.  HENT: Negative for congestion, sore throat and trouble swallowing.   Eyes: Negative for visual disturbance.  Respiratory: Negative for cough, chest tightness, shortness of breath and wheezing.   Cardiovascular: Negative for chest pain and palpitations.  Gastrointestinal: Negative for nausea, vomiting, abdominal pain and diarrhea.  Genitourinary: Negative for dysuria, vaginal bleeding and vaginal discharge.  Musculoskeletal: Negative for back pain and neck pain.  Skin: Positive for rash.       Itching   Neurological: Negative for syncope and headaches.      Allergies  Metronidazole  Home Medications   Prior to Admission medications   Medication Sig Start Date End Date Taking? Authorizing Provider  diphenhydrAMINE (BENADRYL) 25 MG tablet Take 1 tablet (25 mg total) by mouth every 6 (six) hours as needed for itching (Rash). 03/27/16   Everlene Farrier, PA-C  Doxylamine-Pyridoxine 10-10 MG TBEC Take 1 tablet by mouth 2 times daily at 12 noon and 4 pm. Patient not taking: Reported on 04/14/2015 04/14/15   April Palumbo, MD  predniSONE (DELTASONE) 20 MG tablet Take 2 tablets (40 mg total) by mouth daily. 03/27/16   Everlene Farrier, PA-C  Prenatal Vit-Fe Fumarate-FA (PRENATAL MULTIVITAMIN) TABS tablet Take 1 tablet by mouth daily at 12 noon.    Historical Provider, MD  promethazine (PHENERGAN) 25 MG tablet Take 0.5-1 tablets (12.5-25 mg total) by mouth every 6 (six) hours as needed. 04/14/15   Armando Reichert, CNM  ranitidine (ZANTAC) 150 MG capsule Take 1 capsule (150 mg total) by mouth daily. 03/27/16   Everlene Farrier, PA-C   BP 116/84 mmHg  Pulse 72  Temp(Src) 98.7 F (37.1 C) (Oral)  Resp 18  Ht  (1.626 m)  Wt 77.111 kg  BMI 29.17 kg/m2  SpO2 100%  LMP 03/22/2016  Breastfeeding? No Physical Exam  Constitutional: She appears well-developed and well-nourished. No distress.  Nontoxic appearing.  HENT:  Head:  Normocephalic and atraumatic.  Right Ear: External ear normal.  Left Ear: External ear normal.  Mouth/Throat: Oropharynx is clear and moist.  Patient appears to have a hive above her right eye. No other hives noted.  Oropharynx is clear. No tongue or lip swelling. No drooling.  Eyes: Conjunctivae are normal. Pupils are equal, round, and reactive to light. Right eye exhibits no discharge. Left eye exhibits no discharge.  Neck: Normal range of motion. Neck supple.  Cardiovascular: Normal rate, regular rhythm, normal heart sounds and intact distal pulses.  Exam reveals no gallop and no friction rub.   No murmur heard. Pulmonary/Chest: Effort normal and breath sounds normal. No stridor. No respiratory distress. She has no wheezes. She has no rales.  Lungs clear to auscultation bilaterally.  Abdominal: Soft. There is no tenderness. There is no guarding.  Musculoskeletal: She exhibits no edema or tenderness.  Lymphadenopathy:    She has no cervical adenopathy.  Neurological: She is alert. Coordination normal.  Skin: Skin is warm and dry. She is not diaphoretic. No erythema. No pallor.  Psychiatric: She has a normal mood and affect. Her behavior is normal.  Nursing note and vitals reviewed.   ED Course  Procedures (including critical care time) Labs Review Labs Reviewed - No data to display  Imaging Review No results found. I have personally reviewed and evaluated these images and lab results as part of my medical decision-making.   EKG Interpretation None      Filed Vitals:   03/27/16 2053 03/27/16 2300  BP: 121/89 116/84  Pulse: 82 72  Temp: 98.7 F (37.1 C)   TempSrc: Oral   Resp: 18 18  Height:  (1.626 m)   Weight: 77.111 kg   SpO2: 100% 100%     MDM   Meds given in ED:  Medications  fluconazole (DIFLUCAN) tablet 150 mg (not administered)  diphenhydrAMINE (BENADRYL) capsule 25 mg (25 mg Oral Given 03/27/16 2104)  famotidine (PEPCID) tablet 20 mg (20 mg Oral  Given 03/27/16 2156)  diphenhydrAMINE (BENADRYL) capsule 25 mg (25 mg Oral Given 03/27/16 2156)  predniSONE (DELTASONE) tablet 60 mg (60 mg Oral Given 03/27/16 2156)    New Prescriptions   DIPHENHYDRAMINE (BENADRYL) 25 MG TABLET    Take 1 tablet (25 mg total) by mouth every 6 (six) hours as needed for itching (Rash).   PREDNISONE (DELTASONE) 20 MG TABLET    Take 2 tablets (40 mg total) by mouth daily.   RANITIDINE (ZANTAC) 150 MG CAPSULE    Take 1 capsule (150 mg total) by mouth daily.    Final diagnoses:  Medication reaction, initial encounter  Urticaria    This is a 35 y.o. female who presents to the ED Complaining of itching to her body as well as an itchy rash above her right eye. The patient reports she was having some vaginal itching and  symptoms of a yeast infection and called her OB/GYN office who called her and Flagyl. The patient reports she's had no vaginal bleeding or vaginal discharge. She reports she has had yeast infections previously and is tolerating Diflucan well. She has never taken Flagyl for a yeast infection. She reports she took one dose of this and had some itching all over her body. She reports she took a second dose around 7 PM tonight and began having more itching as well as a rash above her right eye. On exam patient is afebrile nontoxic appearing. No tongue or lip swelling. No increased work of breathing. Patient appears to have a hive to above her right eye. She is scratching all over her body. She reports improvement after receiving Benadryl 25 mg in triage. She is still having itching. We'll provide with an additional 25 mg of Benadryl, 60 mg of prednisone and Pepcid. Will monitor for at least 4 hours postingestion. More than 4 hours postingestion the patient reports she is feeling much better. No tongue or lip swelling. She is tolerating by mouth. No trouble breathing. Her rash is improving. Will discharge with Benadryl, ranitidine and prednisone. I advised she needs to  not take Flagyl and to follow-up with her primary care provider. Discussed strict and specific return precautions. I advised the patient to follow-up with their primary care provider this week. I advised the patient to return to the emergency department with new or worsening symptoms or new concerns. The patient verbalized understanding and agreement with plan.    This patient was discussed with Dr. Adela LankFloyd who agrees with assessment and plan.   Everlene FarrierWilliam Aldora Perman, PA-C 03/28/16 0007  Melene Planan Floyd, DO 03/28/16 [id number]

## 2016-03-27 NOTE — ED Notes (Signed)
Pt reports started on Flagyl this morning after she phoned the doctor for a yeast infection. Pt reports right eyes edema, itching, swelling after 2nd dose. No benadryl has been taken.

## 2016-03-28 NOTE — ED Notes (Signed)
Pt given d/c instructions as per chart. Rx x 3. Verbalizes understanding. No questions. 

## 2017-01-04 ENCOUNTER — Encounter (HOSPITAL_BASED_OUTPATIENT_CLINIC_OR_DEPARTMENT_OTHER): Payer: Self-pay | Admitting: *Deleted

## 2017-01-04 ENCOUNTER — Emergency Department (HOSPITAL_BASED_OUTPATIENT_CLINIC_OR_DEPARTMENT_OTHER)
Admission: EM | Admit: 2017-01-04 | Discharge: 2017-01-04 | Disposition: A | Discharge status: Home / Self Care | Payer: Medicaid Other | Attending: Emergency Medicine | Admitting: Emergency Medicine

## 2017-01-04 DIAGNOSIS — R509 Fever, unspecified: Secondary | ICD-10-CM | POA: Insufficient documentation

## 2017-01-04 DIAGNOSIS — R05 Cough: Secondary | ICD-10-CM | POA: Diagnosis not present

## 2017-01-04 DIAGNOSIS — R0981 Nasal congestion: Secondary | ICD-10-CM | POA: Insufficient documentation

## 2017-01-04 DIAGNOSIS — R69 Illness, unspecified: Secondary | ICD-10-CM

## 2017-01-04 DIAGNOSIS — J111 Influenza due to unidentified influenza virus with other respiratory manifestations: Secondary | ICD-10-CM

## 2017-01-04 DIAGNOSIS — R197 Diarrhea, unspecified: Secondary | ICD-10-CM | POA: Insufficient documentation

## 2017-01-04 MED ORDER — ONDANSETRON 4 MG PO TBDP: 4.0000 mg | ORAL_TABLET | Freq: Three times a day (TID) | ORAL | 0 refills | Status: AC | PRN

## 2017-01-04 MED ORDER — ACETAMINOPHEN 325 MG PO TABS
650.0000 mg | ORAL_TABLET | Freq: Once | ORAL | Status: AC
  Administered 2017-01-04: 650 mg via ORAL
  Filled 2017-01-04: qty 2

## 2017-01-04 MED ORDER — CIPROFLOXACIN HCL 500 MG PO TABS: 500.0000 mg | ORAL_TABLET | Freq: Two times a day (BID) | ORAL | 0 refills | Status: AC

## 2017-01-04 MED FILL — CIPROFLOXACIN HCL 500 MG TA: 500 | 5 days supply | Qty: 10 | Fill #0

## 2017-01-04 MED FILL — ONDANSETRON ODT 4 MG TABLET: 4 | 3 days supply | Qty: 10 | Fill #0

## 2017-01-04 NOTE — ED Triage Notes (Signed)
Fever, cough, ear pain, diarrhea and no energy x 2 days.

## 2017-01-04 NOTE — ED Provider Notes (Signed)
[facility]   CSN: [id number] Arrival date & time: 01/04/17  1503     History   Chief Complaint Chief Complaint  Patient presents with  . Fever  . Cough    HPI [employer] SchoolJonita Zeien is a 36 y.o. female.  The history is provided by the patient.  Fever   This is a new problem. Episode onset: 3 days. The problem occurs daily. The problem has not changed since onset.The maximum temperature noted was 99 to 99.9 F. Associated symptoms include diarrhea (6 per day; watery; NB), congestion, muscle aches and cough. Pertinent negatives include no chest pain, no vomiting and no sore throat. Treatments tried: mucinex. The treatment provided mild relief.  Cough  The cough is non-productive. Associated symptoms include chills and rhinorrhea. Pertinent negatives include no chest pain, no ear pain, no sore throat and no shortness of breath. She has tried decongestants for the symptoms.   Just returned from 5 day trip to [place] on Sun (3 days ago).  Past Medical History:  Diagnosis Date  . Gestational diabetes   . History of hiatal hernia   . Miscarriage   . Pregnant and not yet delivered in third trimester     There are no active problems to display for this patient.   Past Surgical History:  Procedure Laterality Date  . DILATION AND CURETTAGE OF UTERUS    . HERNIA REPAIR    . WISDOM TOOTH EXTRACTION Bilateral     OB History    Gravida Para Term Preterm AB Living   4 2 2   1 2    SAB TAB Ectopic Multiple Live Births   1               Home Medications    Prior to Admission medications   Medication Sig Start Date End Date Taking? Authorizing Provider  ciprofloxacin (CIPRO) 500 MG tablet Take 1 tablet (500 mg total) by mouth 2 (two) times daily. 01/09/17 01/14/17  Nira ConnPedro Eduardo Evlyn Amason, MD  diphenhydrAMINE (BENADRYL) 25 MG tablet Take 1 tablet (25 mg total) by mouth every 6 (six) hours as needed for itching (Rash). 03/27/16   Everlene FarrierWilliam Dansie, PA-C    Doxylamine-Pyridoxine 10-10 MG TBEC Take 1 tablet by mouth 2 times daily at 12 noon and 4 pm. Patient not taking: Reported on 04/14/2015 04/14/15   April Palumbo, MD  ondansetron (ZOFRAN ODT) 4 MG disintegrating tablet Take 1 tablet (4 mg total) by mouth every 8 (eight) hours as needed for nausea or vomiting. 01/04/17 01/07/17  Nira ConnPedro Eduardo Gavin Faivre, MD  predniSONE (DELTASONE) 20 MG tablet Take 2 tablets (40 mg total) by mouth daily. 03/27/16   Everlene FarrierWilliam Dansie, PA-C  Prenatal Vit-Fe Fumarate-FA (PRENATAL MULTIVITAMIN) TABS tablet Take 1 tablet by mouth daily at 12 noon.    Historical Provider, MD  promethazine (PHENERGAN) 25 MG tablet Take 0.5-1 tablets (12.5-25 mg total) by mouth every 6 (six) hours as needed. 04/14/15   Armando ReichertHeather D Hogan, CNM  ranitidine (ZANTAC) 150 MG capsule Take 1 capsule (150 mg total) by mouth daily. 03/27/16   Everlene FarrierWilliam Dansie, PA-C    Family History No family history on file.  Social History Social History  Substance Use Topics  . Smoking status: Never Smoker  . Smokeless tobacco: Never Used  . Alcohol use No     Allergies   Metronidazole   Review of Systems Review of Systems  Constitutional: Positive for chills and fever.  HENT: Positive for congestion and rhinorrhea. Negative for  ear pain and sore throat.   Eyes: Negative for pain and visual disturbance.  Respiratory: Positive for cough. Negative for shortness of breath.   Cardiovascular: Negative for chest pain and palpitations.  Gastrointestinal: Positive for diarrhea (6 per day; watery; NB). Negative for abdominal pain and vomiting.  Genitourinary: Negative for dysuria and hematuria.  Musculoskeletal: Negative for arthralgias and back pain.  Skin: Negative for color change and rash.  Neurological: Negative for seizures and syncope.  All other systems reviewed and are negative.    Physical Exam Updated Vital Signs BP 113/78 (BP Location: Right Arm)   Pulse 109   Temp 100.8 F (38.2 C) (Oral) Comment:  Pt drank something cold before taking Temp  Resp 20   Ht 5\' 4"  (1.626 m)   Wt 170 lb (77.1 kg)   LMP 12/21/2016   SpO2 99%   BMI 29.18 kg/m   Physical Exam  Constitutional: She is oriented to person, place, and time. She appears well-developed and well-nourished. No distress.  HENT:  Head: Normocephalic and atraumatic.  Nose: Nose normal.  Eyes: Conjunctivae and EOM are normal. Pupils are equal, round, and reactive to light. Right eye exhibits no discharge. Left eye exhibits no discharge. No scleral icterus.  Neck: Normal range of motion. Neck supple.  Cardiovascular: Regular rhythm.  Tachycardia present.  Exam reveals no gallop and no friction rub.   No murmur heard. Pulmonary/Chest: Effort normal and breath sounds normal. No stridor. No respiratory distress. She has no rales.  Abdominal: Soft. She exhibits no distension. There is no tenderness.  Musculoskeletal: She exhibits no edema or tenderness.  Neurological: She is alert and oriented to person, place, and time.  Skin: Skin is warm and dry. No rash noted. She is not diaphoretic. No erythema.  Psychiatric: She has a normal mood and affect.  Vitals reviewed.    ED Treatments / Results  Labs (all labs ordered are listed, but only abnormal results are displayed) Labs Reviewed - No data to display  EKG  EKG Interpretation None       Radiology No results found.  Procedures Procedures (including critical care time)  Medications Ordered in ED Medications - No data to display   Initial Impression / Assessment and Plan / ED Course  I have reviewed the triage vital signs and the nursing notes.  Pertinent labs & imaging results that were available during my care of the patient were reviewed by me and considered in my medical decision making (see chart for details).  Clinical Course    The patient appears well, in no acute distress, without evidence of toxicity or dehydration.   Presentation is consistent with  flu-like illness, given the fact that she was just in [place], will provide pt with short course of Cipro in case her diarrhea does not improve in several days.  The patient is safe for discharge with strict return precautions.   Final Clinical Impressions(s) / ED Diagnoses   Final diagnoses:  Influenza-like illness  Diarrhea of presumed infectious origin   Disposition: Discharge  Condition: Good  I have discussed the results, Dx and Tx plan with the patient who expressed understanding and agree(s) with the plan. Discharge instructions discussed at great length. The patient was given strict return precautions who verbalized understanding of the instructions. No further questions at time of discharge.    New Prescriptions   CIPROFLOXACIN (CIPRO) 500 MG TABLET    Take 1 tablet (500 mg total) by mouth 2 (two) times daily.  ONDANSETRON (ZOFRAN ODT) 4 MG DISINTEGRATING TABLET    Take 1 tablet (4 mg total) by mouth every 8 (eight) hours as needed for nausea or vomiting.    Follow Up: Primary care provider  Schedule an appointment as soon as possible for a visit        Nira Conn, MD 01/04/17 [phone]

## 2017-05-01 ENCOUNTER — Emergency Department (HOSPITAL_BASED_OUTPATIENT_CLINIC_OR_DEPARTMENT_OTHER): Payer: Medicaid Other

## 2017-05-01 ENCOUNTER — Emergency Department (HOSPITAL_BASED_OUTPATIENT_CLINIC_OR_DEPARTMENT_OTHER)
Admission: EM | Admit: 2017-05-01 | Discharge: 2017-05-01 | Disposition: A | Discharge status: Home / Self Care | Payer: Medicaid Other | Attending: Emergency Medicine | Admitting: Emergency Medicine

## 2017-05-01 ENCOUNTER — Encounter (HOSPITAL_BASED_OUTPATIENT_CLINIC_OR_DEPARTMENT_OTHER): Payer: Self-pay | Admitting: Emergency Medicine

## 2017-05-01 DIAGNOSIS — N3000 Acute cystitis without hematuria: Secondary | ICD-10-CM

## 2017-05-01 DIAGNOSIS — R079 Chest pain, unspecified: Secondary | ICD-10-CM

## 2017-05-01 DIAGNOSIS — G8918 Other acute postprocedural pain: Secondary | ICD-10-CM

## 2017-05-01 DIAGNOSIS — R0781 Pleurodynia: Secondary | ICD-10-CM | POA: Diagnosis not present

## 2017-05-01 DIAGNOSIS — R0602 Shortness of breath: Secondary | ICD-10-CM | POA: Diagnosis present

## 2017-05-01 DIAGNOSIS — M6289 Other specified disorders of muscle: Secondary | ICD-10-CM

## 2017-05-01 HISTORY — DX: Endometriosis, unspecified: N80.9

## 2017-05-01 LAB — COMPREHENSIVE METABOLIC PANEL
ALT: 64 U/L — ABNORMAL HIGH (ref 14–54)
AST: 23 U/L (ref 15–41)
Albumin: 3.6 g/dL (ref 3.5–5.0)
Alkaline Phosphatase: 54 U/L (ref 38–126)
Anion gap: 12 (ref 5–15)
BUN: 10 mg/dL (ref 6–20)
CO2: 25 mmol/L (ref 22–32)
Calcium: 9.2 mg/dL (ref 8.9–10.3)
Chloride: 99 mmol/L — ABNORMAL LOW (ref 101–111)
Creatinine, Ser: 0.81 mg/dL (ref 0.44–1.00)
GFR calc Af Amer: >60 mL/min (ref >60)
GFR calc non Af Amer: >60 mL/min (ref >60)
Glucose, Bld: 99 mg/dL (ref 65–99)
Potassium: 3.1 mmol/L — ABNORMAL LOW (ref 3.5–5.1)
Sodium: 136 mmol/L (ref 135–145)
Total Bilirubin: 0.7 mg/dL (ref 0.3–1.2)
Total Protein: 7.7 g/dL (ref 6.5–8.1)

## 2017-05-01 LAB — CBC WITH DIFFERENTIAL/PLATELET
Basophils Absolute: 0 10*3/uL (ref 0.0–0.1)
Basophils Relative: 0 %
Eosinophils Absolute: 0.1 10*3/uL (ref 0.0–0.7)
Eosinophils Relative: 1 %
HCT: 38.3 % (ref 36.0–46.0)
Hemoglobin: 13.2 g/dL (ref 12.0–15.0)
Lymphocytes Relative: 15 %
Lymphs Abs: 1.6 10*3/uL (ref 0.7–4.0)
MCH: 26.1 pg (ref 26.0–34.0)
MCHC: 34.5 g/dL (ref 30.0–36.0)
MCV: 75.7 fL — ABNORMAL LOW (ref 78.0–100.0)
Monocytes Absolute: 1.1 10*3/uL — ABNORMAL HIGH (ref 0.1–1.0)
Monocytes Relative: 10 %
Neutro Abs: 7.9 10*3/uL — ABNORMAL HIGH (ref 1.7–7.7)
Neutrophils Relative %: 74 %
Platelets: 508 10*3/uL — ABNORMAL HIGH (ref 150–400)
RBC: 5.06 MIL/uL (ref 3.87–5.11)
RDW: 15.9 % — ABNORMAL HIGH (ref 11.5–15.5)
WBC: 10.7 10*3/uL — ABNORMAL HIGH (ref 4.0–10.5)

## 2017-05-01 LAB — URINALYSIS, ROUTINE W REFLEX MICROSCOPIC
Glucose, UA: NEGATIVE mg/dL
Ketones, ur: >80 mg/dL — AB
Leukocytes, UA: NEGATIVE
Nitrite: POSITIVE — AB
Protein, ur: 30 mg/dL — AB
Specific Gravity, Urine: 1.023 (ref 1.005–1.030)
pH: 6 (ref 5.0–8.0)

## 2017-05-01 LAB — LIPASE, BLOOD: Lipase: 71 U/L — ABNORMAL HIGH (ref 11–51)

## 2017-05-01 LAB — TROPONIN I: Troponin I: <0.03 ng/mL (ref <0.03)

## 2017-05-01 LAB — PREGNANCY, URINE: Preg Test, Ur: NEGATIVE

## 2017-05-01 LAB — I-STAT CG4 LACTIC ACID, ED: Lactic Acid, Venous: 1.18 mmol/L (ref 0.5–1.9)

## 2017-05-01 LAB — URINALYSIS, MICROSCOPIC (REFLEX)

## 2017-05-01 MED ORDER — POTASSIUM CHLORIDE CRYS ER 20 MEQ PO TBCR
40.0000 meq | EXTENDED_RELEASE_TABLET | Freq: Once | ORAL | Status: AC
  Administered 2017-05-01: 40 meq via ORAL
  Filled 2017-05-01: qty 2

## 2017-05-01 MED ORDER — SODIUM CHLORIDE 0.9 % IV BOLUS (SEPSIS)
1000.0000 mL | Freq: Once | INTRAVENOUS | Status: AC
  Administered 2017-05-01: 1000 mL via INTRAVENOUS

## 2017-05-01 MED ORDER — DIAZEPAM 5 MG/ML IJ SOLN
5.0000 mg | Freq: Once | INTRAMUSCULAR | Status: AC
  Administered 2017-05-01: 5 mg via INTRAVENOUS
  Filled 2017-05-01: qty 2

## 2017-05-01 MED ORDER — IOPAMIDOL (ISOVUE-370) INJECTION 76%
100.0000 mL | Freq: Once | INTRAVENOUS | Status: AC | PRN
  Administered 2017-05-01: 100 mL via INTRAVENOUS

## 2017-05-01 MED ORDER — CYCLOBENZAPRINE HCL 10 MG PO TABS: 10.0000 mg | ORAL_TABLET | Freq: Two times a day (BID) | ORAL | 0 refills | Status: AC | PRN

## 2017-05-01 MED ORDER — CEPHALEXIN 500 MG PO CAPS: 500.0000 mg | ORAL_CAPSULE | Freq: Three times a day (TID) | ORAL | 0 refills | Status: AC

## 2017-05-01 NOTE — ED Notes (Signed)
Patient transported to CT and XR 

## 2017-05-01 NOTE — ED Triage Notes (Addendum)
Has been having epigastric and LUQ pain x 1-2 months. Was d/c from [facility] x 2 days ago, with pneumonia . States is having cramping all over. Hernia repair done on 4/26. Incision to abd with s/s of infection, wearing abd binder . Tearful, states" I did not want to be d/c'ed from hospital and I don't know what is wrong with me and I don't want to die' Reassurance given. Has a MD appointment in am

## 2017-05-01 NOTE — ED Notes (Signed)
Pt tearful. RN and MD aware.

## 2017-05-01 NOTE — ED Provider Notes (Signed)
[facility] Provider Note   CSN: [id number] Arrival date & time: 05/01/17  0903     History   Chief Complaint Chief Complaint  Patient presents with  . Abdominal Pain    HPI Lauren Drake is a 36 y.o. female.  HPI   Stiffness in neck, shoulder and joints and sharp pain in chest. Stiffness in neck, shoulder, joints began last night Sharp chest pain began this morning after sitting up to go to the bathroom, sharp pain in the middle of the chest. Began on left side, sharp then moved to front, then noticed stiffness in neck, shoulder area.  Got up and walked and felt better, but laying down stays the same.  Worse with deep breaths.  Shortness of breath began this morning.   Was scanned 5/1 for PE but reports pain was in different location then.  Was having same pain above the surgical site, surgeon instructed her to increase dosage of pain medications, rotating hydrocodone and oxycodone, told him she was still having pain and not eating or drinking well and then she returned to ED for dyspnea and pain, was admitted for pneumonia, discharged 2 days ago.   Past Medical History:  Diagnosis Date  . Endometriosis   . History of hiatal hernia   . Miscarriage   . Pregnant and not yet delivered in third trimester     There are no active problems to display for this patient.   Past Surgical History:  Procedure Laterality Date  . DILATION AND CURETTAGE OF UTERUS    . HERNIA REPAIR    . WISDOM TOOTH EXTRACTION Bilateral     OB History    Gravida Para Term Preterm AB Living   4 2 2   1 2    SAB TAB Ectopic Multiple Live Births   1               Home Medications    Prior to Admission medications   Medication Sig Start Date End Date Taking? Authorizing Provider  cephALEXin (KEFLEX) 500 MG capsule Take 1 capsule (500 mg total) by mouth 3 (three) times daily. 05/01/17 05/08/17  Alvira Monday, MD  cyclobenzaprine (FLEXERIL) 10 MG tablet Take 1 tablet (10 mg total) by  mouth 2 (two) times daily as needed for muscle spasms. 05/01/17   Alvira Monday, MD  diphenhydrAMINE (BENADRYL) 25 MG tablet Take 1 tablet (25 mg total) by mouth every 6 (six) hours as needed for itching (Rash). 03/27/16   Everlene Farrier, PA-C  Doxylamine-Pyridoxine 10-10 MG TBEC Take 1 tablet by mouth 2 times daily at 12 noon and 4 pm. Patient not taking: Reported on 04/14/2015 04/14/15   Palumbo, April, MD  predniSONE (DELTASONE) 20 MG tablet Take 2 tablets (40 mg total) by mouth daily. 03/27/16   Everlene Farrier, PA-C  Prenatal Vit-Fe Fumarate-FA (PRENATAL MULTIVITAMIN) TABS tablet Take 1 tablet by mouth daily at 12 noon.    [provider]  promethazine (PHENERGAN) 25 MG tablet Take 0.5-1 tablets (12.5-25 mg total) by mouth every 6 (six) hours as needed. 04/14/15   Armando Reichert, CNM  ranitidine (ZANTAC) 150 MG capsule Take 1 capsule (150 mg total) by mouth daily. 03/27/16   Everlene Farrier, PA-C    Family History No family history on file.  Social History Social History  Substance Use Topics  . Smoking status: Never Smoker  . Smokeless tobacco: Never Used  . Alcohol use No     Allergies   Metronidazole   Review  of Systems Review of Systems  Constitutional: Positive for fatigue. Negative for fever.  HENT: Negative for sore throat.   Eyes: Negative for visual disturbance.  Respiratory: Positive for shortness of breath. Negative for cough.   Cardiovascular: Positive for chest pain.  Gastrointestinal: Positive for abdominal pain (same since surgery). Negative for constipation, diarrhea (passing flatus), nausea and vomiting.  Genitourinary: Negative for difficulty urinating.  Musculoskeletal: Negative for back pain and neck pain.  Skin: Negative for rash.  Neurological: Positive for light-headedness. Negative for syncope and headaches.     Physical Exam Updated Vital Signs BP 113/80   Pulse 100   Temp 99.1 F (37.3 C) (Oral)   Resp (!) 24   Ht 5\' 4"  (1.626 m)    Wt 166 lb 14.4 oz (75.7 kg)   LMP 04/26/2017   SpO2 98%   BMI 28.65 kg/m   Physical Exam  Constitutional: She is oriented to person, place, and time. She appears well-developed and well-nourished. No distress.  HENT:  Head: Normocephalic and atraumatic.  Eyes: Conjunctivae and EOM are normal.  Neck: Normal range of motion.  Cardiovascular: Regular rhythm, normal heart sounds and intact distal pulses.  Tachycardia present.  Exam reveals no gallop and no friction rub.   No murmur heard. Pulmonary/Chest: Effort normal and breath sounds normal. No respiratory distress. She has no wheezes. She has no rales.  Abdominal: Soft. She exhibits no distension. There is tenderness (diffuse). There is no guarding.  Musculoskeletal: She exhibits no edema or tenderness.  Neurological: She is alert and oriented to person, place, and time.  Skin: Skin is warm and dry. No rash noted. She is not diaphoretic. No erythema.  Nursing note and vitals reviewed.    ED Treatments / Results  Labs (all labs ordered are listed, but only abnormal results are displayed) Labs Reviewed  CBC WITH DIFFERENTIAL/PLATELET - Abnormal; Notable for the following:       Result Value   WBC 10.7 (*)    MCV 75.7 (*)    RDW 15.9 (*)    Platelets 508 (*)    Neutro Abs 7.9 (*)    Monocytes Absolute 1.1 (*)    All other components within normal limits  COMPREHENSIVE METABOLIC PANEL - Abnormal; Notable for the following:    Potassium 3.1 (*)    Chloride 99 (*)    ALT 64 (*)    All other components within normal limits  LIPASE, BLOOD - Abnormal; Notable for the following:    Lipase 71 (*)    All other components within normal limits  URINALYSIS, ROUTINE W REFLEX MICROSCOPIC - Abnormal; Notable for the following:    Color, Urine ORANGE (*)    APPearance CLOUDY (*)    Hgb urine dipstick MODERATE (*)    Bilirubin Urine SMALL (*)    Ketones, ur >80 (*)    Protein, ur 30 (*)    Nitrite POSITIVE (*)    All other components  within normal limits  URINALYSIS, MICROSCOPIC (REFLEX) - Abnormal; Notable for the following:    Bacteria, UA MANY (*)    Squamous Epithelial / LPF 0-5 (*)    All other components within normal limits  TROPONIN I  PREGNANCY, URINE  I-STAT CG4 LACTIC ACID, ED    EKG  EKG Interpretation  Date/Time:  Sunday May 01 2017 09:19:16 EDT Ventricular Rate:  120 PR Interval:    QRS Duration: 72 QT Interval:  317 QTC Calculation: 448 R Axis:   30 Text Interpretation:  Sinus tachycardia Probable left atrial enlargement No previous ECGs available Confirmed by [facility] MD, Sinead Hockman ([id number]) on 05/01/2017 9:27:13 AM       Radiology Ct Angio Chest Pe W Or Wo Contrast  Result Date: 05/01/2017 CLINICAL DATA:  Lower chest pain and shortness of breath. EXAM: CT ANGIOGRAPHY CHEST WITH CONTRAST TECHNIQUE: Multidetector CT imaging of the chest was performed using the standard protocol during bolus administration of intravenous contrast. Multiplanar CT image reconstructions and MIPs were obtained to evaluate the vascular anatomy. CONTRAST:  100 mL of Isovue 370 COMPARISON:  CT of the abdomen and pelvis Apr 24, 2017 and chest x-ray Apr 27, 2017. CT of the chest Apr 19, 2017. FINDINGS: Cardiovascular: The thoracic aorta is normal. The heart is unremarkable. Respiratory motion mildly limits evaluation of the lower lobe pulmonary arteries. Within this limitation, no pulmonary emboli are identified. Mediastinum/Nodes: There is a small hiatal hernia. The thyroid is normal. No adenopathy. No effusion. Lungs/Pleura: Central airways are within normal limits. No pneumothorax is identified. Infiltrate in the posterior left base has improved but not completely resolved since Apr 24, 2017. No new infiltrates are seen. Mild atelectasis in the left base. No suspicious nodules or masses. Upper Abdomen: Probable cyst in the right hepatic lobe. Small hiatal hernia. Hernia mesh of the upper abdomen. No other acute abnormalities.  Musculoskeletal: No chest wall abnormality. No acute or significant osseous findings. Review of the MIP images confirms the above findings. IMPRESSION: 1. Improving left lower lobe infiltrate. Recommend follow-up to complete resolution. 2. Evaluation for pulmonary emboli is somewhat limited due to respiratory motion in the bases. However, no emboli are identified. Electronically Signed   By: Gerome Sam III M.D   On: 05/01/2017 11:19    Procedures Procedures (including critical care time)  Medications Ordered in ED Medications  sodium chloride 0.9 % bolus 1,000 mL (0 mLs Intravenous Stopped 05/01/17 1143)  diazepam (VALIUM) injection 5 mg (5 mg Intravenous Given 05/01/17 1007)  iopamidol (ISOVUE-370) 76 % injection 100 mL (100 mLs Intravenous Contrast Given 05/01/17 1040)  potassium chloride SA (K-DUR,KLOR-CON) CR tablet 40 mEq (40 mEq Oral Given 05/01/17 1147)     Initial Impression / Assessment and Plan / ED Course  I have reviewed the triage vital signs and the nursing notes.  Pertinent labs & imaging results that were available during my care of the patient were reviewed by me and considered in my medical decision making (see chart for details).     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  admitted [facility] for sepsis secondary to HCAP and ileus with discharge 2 days ago presents with acute onset sharp pleuritic chest pain, dyspnea, and stiffness in neck and shoulders. Patient tachycardic to the 120s, describing pleuritic chest pain and dyspnea, with history of recent surgery. CT PE study was done given high well's score and shows resolving pneumonia. Rec repeat imaging for resolution.  Troponin negative. Doubt cardiac etiology of pain. Her lactic acid is within normal limits.   Patient with likely other muscle ache,with neck/shoulder stiffness, recommend muscle  relaxants. Abdominal pain and tenderness is unchanged and given recent evaluation, feel this is likely continuing post-surgical pain.   Pt given potassium. Urinalysis likely affected by pt taking azo, however given she reports dysuria will give rx for keflex for possible UTI. Given flexeril rx for muscle aches. Patient discharged in stable condition with understanding of reasons to return.  Final Clinical Impressions(s) / ED Diagnoses   Final diagnoses:  Chest pain  Post-operative pain  Muscle stiffness  Acute cystitis without hematuria    New Prescriptions Discharge Medication List as of 05/01/2017 11:32 AM    START taking these medications   Details  cephALEXin (KEFLEX) 500 MG capsule Take 1 capsule (500 mg total) by mouth 3 (three) times daily., Starting Sun 05/01/2017, Until Sun 05/08/2017, Print    cyclobenzaprine (FLEXERIL) 10 MG tablet Take 1 tablet (10 mg total) by mouth 2 (two) times daily as needed for muscle spasms., Starting Sun 05/01/2017, Print         Alvira MondaySchlossman, Guila Owensby, MD 05/01/17 2225

## 2018-02-23 ENCOUNTER — Emergency Department (HOSPITAL_BASED_OUTPATIENT_CLINIC_OR_DEPARTMENT_OTHER): Payer: Managed Care, Other (non HMO)

## 2018-02-23 ENCOUNTER — Encounter (HOSPITAL_BASED_OUTPATIENT_CLINIC_OR_DEPARTMENT_OTHER): Payer: Self-pay | Admitting: *Deleted

## 2018-02-23 ENCOUNTER — Emergency Department (HOSPITAL_BASED_OUTPATIENT_CLINIC_OR_DEPARTMENT_OTHER)
Admission: EM | Admit: 2018-02-23 | Discharge: 2018-02-24 | Disposition: A | Discharge status: Home / Self Care | Payer: Managed Care, Other (non HMO) | Attending: Emergency Medicine | Admitting: Emergency Medicine

## 2018-02-23 ENCOUNTER — Other Ambulatory Visit: Payer: Self-pay

## 2018-02-23 DIAGNOSIS — Z79899 Other long term (current) drug therapy: Secondary | ICD-10-CM | POA: Insufficient documentation

## 2018-02-23 DIAGNOSIS — J189 Pneumonia, unspecified organism: Secondary | ICD-10-CM | POA: Diagnosis not present

## 2018-02-23 DIAGNOSIS — R05 Cough: Secondary | ICD-10-CM | POA: Diagnosis not present

## 2018-02-23 DIAGNOSIS — R059 Cough, unspecified: Secondary | ICD-10-CM

## 2018-02-23 DIAGNOSIS — R509 Fever, unspecified: Secondary | ICD-10-CM | POA: Diagnosis present

## 2018-02-23 LAB — CBC WITH DIFFERENTIAL/PLATELET
Basophils Absolute: 0 10*3/uL (ref 0.0–0.1)
Basophils Relative: 0 %
Eosinophils Absolute: 0.1 10*3/uL (ref 0.0–0.7)
Eosinophils Relative: 1 %
HCT: 38.3 % (ref 36.0–46.0)
Hemoglobin: 13.1 g/dL (ref 12.0–15.0)
Lymphocytes Relative: 12 %
Lymphs Abs: 0.8 10*3/uL (ref 0.7–4.0)
MCH: 27.6 pg (ref 26.0–34.0)
MCHC: 34.2 g/dL (ref 30.0–36.0)
MCV: 80.6 fL (ref 78.0–100.0)
Monocytes Absolute: 1 10*3/uL (ref 0.1–1.0)
Monocytes Relative: 14 %
Neutro Abs: 4.9 10*3/uL (ref 1.7–7.7)
Neutrophils Relative %: 73 %
Platelets: 211 10*3/uL (ref 150–400)
RBC: 4.75 MIL/uL (ref 3.87–5.11)
RDW: 14.9 % (ref 11.5–15.5)
WBC: 6.7 10*3/uL (ref 4.0–10.5)

## 2018-02-23 LAB — COMPREHENSIVE METABOLIC PANEL
ALT: 19 U/L (ref 14–54)
AST: 28 U/L (ref 15–41)
Albumin: 3.6 g/dL (ref 3.5–5.0)
Alkaline Phosphatase: 37 U/L — ABNORMAL LOW (ref 38–126)
Anion gap: 5 (ref 5–15)
BUN: 8 mg/dL (ref 6–20)
CO2: 22 mmol/L (ref 22–32)
Calcium: 8.2 mg/dL — ABNORMAL LOW (ref 8.9–10.3)
Chloride: 103 mmol/L (ref 101–111)
Creatinine, Ser: 0.92 mg/dL (ref 0.44–1.00)
GFR calc Af Amer: >60 mL/min (ref >60)
GFR calc non Af Amer: >60 mL/min (ref >60)
Glucose, Bld: 105 mg/dL — ABNORMAL HIGH (ref 65–99)
Potassium: 3.2 mmol/L — ABNORMAL LOW (ref 3.5–5.1)
Sodium: 130 mmol/L — ABNORMAL LOW (ref 135–145)
Total Bilirubin: 0.6 mg/dL (ref 0.3–1.2)
Total Protein: 7.3 g/dL (ref 6.5–8.1)

## 2018-02-23 LAB — URINALYSIS, MICROSCOPIC (REFLEX)

## 2018-02-23 LAB — URINALYSIS, ROUTINE W REFLEX MICROSCOPIC
Bilirubin Urine: NEGATIVE
Glucose, UA: NEGATIVE mg/dL
Ketones, ur: 15 mg/dL — AB
Leukocytes, UA: NEGATIVE
Nitrite: NEGATIVE
Protein, ur: NEGATIVE mg/dL
Specific Gravity, Urine: 1.02 (ref 1.005–1.030)
pH: 7 (ref 5.0–8.0)

## 2018-02-23 LAB — PREGNANCY, URINE: Preg Test, Ur: NEGATIVE

## 2018-02-23 MED ORDER — LEVOFLOXACIN 750 MG PO TABS
750.0000 mg | ORAL_TABLET | Freq: Once | ORAL | Status: AC
  Administered 2018-02-23: 750 mg via ORAL
  Filled 2018-02-23: qty 1

## 2018-02-23 MED ORDER — ACETAMINOPHEN 325 MG PO TABS
650.0000 mg | ORAL_TABLET | Freq: Once | ORAL | Status: AC
  Administered 2018-02-23: 650 mg via ORAL
  Filled 2018-02-23: qty 2

## 2018-02-23 MED ORDER — SODIUM CHLORIDE 0.9 % IV BOLUS (SEPSIS)
1000.0000 mL | Freq: Once | INTRAVENOUS | Status: AC
  Administered 2018-02-23: 1000 mL via INTRAVENOUS

## 2018-02-23 MED ORDER — LEVOFLOXACIN 500 MG PO TABS
500.0000 mg | ORAL_TABLET | Freq: Once | ORAL | Status: DC
Start: 2018-02-23 — End: 2018-02-23

## 2018-02-23 NOTE — ED Notes (Signed)
Pt. Is in radiology at present time

## 2018-02-23 NOTE — ED Provider Notes (Signed)
[facility] EMERGENCY DEPARTMENT Provider Note   CSN: [id number] Arrival date & time: 02/23/18  1813     History   Chief Complaint No chief complaint on file.   HPI Madisin Gaertner is a 37 y.o. female.  The history is provided by the patient, medical records and the spouse. No language interpreter was used.  URI   This is a new problem. The current episode started more than 2 days ago. The problem has been gradually worsening. The maximum temperature recorded prior to her arrival was 102 to 102.9 F. Associated symptoms include congestion and cough. Pertinent negatives include no chest pain, no abdominal pain, no diarrhea, no nausea, no vomiting, no dysuria, no headaches, no rhinorrhea, no neck pain, no rash and no wheezing. She has tried nothing for the symptoms. The treatment provided no relief.  Rectal Bleeding  Quality:  Bright red Amount:  Scant Duration:  1 day Timing:  Rare Chronicity:  Recurrent Context: hemorrhoids   Similar prior episodes: yes   Relieved by:  Nothing Worsened by:  Nothing Associated symptoms: fever   Associated symptoms: no abdominal pain, no dizziness, no light-headedness and no vomiting   Risk factors: no anticoagulant use     Past Medical History:  Diagnosis Date  . Endometriosis   . History of hiatal hernia   . Miscarriage   . Pregnant and not yet delivered in third trimester     There are no active problems to display for this patient.   Past Surgical History:  Procedure Laterality Date  . DILATION AND CURETTAGE OF UTERUS    . HERNIA REPAIR    . WISDOM TOOTH EXTRACTION Bilateral     OB History    Gravida Para Term Preterm AB Living   4 2 2   1 2    SAB TAB Ectopic Multiple Live Births   1               Home Medications    Prior to Admission medications   Medication Sig Start Date End Date Taking? Authorizing Provider  cyclobenzaprine (FLEXERIL) 10 MG tablet Take 1 tablet (10 mg total) by mouth 2 (two) times daily  as needed for muscle spasms. 05/01/17   Alvira Monday, MD  diphenhydrAMINE (BENADRYL) 25 MG tablet Take 1 tablet (25 mg total) by mouth every 6 (six) hours as needed for itching (Rash). 03/27/16   Everlene Farrier, PA-C  Doxylamine-Pyridoxine 10-10 MG TBEC Take 1 tablet by mouth 2 times daily at 12 noon and 4 pm. Patient not taking: Reported on 04/14/2015 04/14/15   Palumbo, April, MD  predniSONE (DELTASONE) 20 MG tablet Take 2 tablets (40 mg total) by mouth daily. 03/27/16   Everlene Farrier, PA-C  Prenatal Vit-Fe Fumarate-FA (PRENATAL MULTIVITAMIN) TABS tablet Take 1 tablet by mouth daily at 12 noon.    [provider]  promethazine (PHENERGAN) 25 MG tablet Take 0.5-1 tablets (12.5-25 mg total) by mouth every 6 (six) hours as needed. 04/14/15   Armando Reichert, CNM  ranitidine (ZANTAC) 150 MG capsule Take 1 capsule (150 mg total) by mouth daily. 03/27/16   Everlene Farrier, PA-C    Family History No family history on file.  Social History Social History   Tobacco Use  . Smoking status: Never Smoker  . Smokeless tobacco: Never Used  Substance Use Topics  . Alcohol use: No  . Drug use: No     Allergies   Metronidazole   Review of Systems Review of Systems  Constitutional:  Positive for chills, fatigue and fever. Negative for diaphoresis.  HENT: Positive for congestion. Negative for rhinorrhea.   Eyes: Negative for visual disturbance.  Respiratory: Positive for cough. Negative for choking, chest tightness, shortness of breath, wheezing and stridor.   Cardiovascular: Negative for chest pain and palpitations.  Gastrointestinal: Positive for anal bleeding, hematochezia and rectal pain. Negative for abdominal pain, constipation, diarrhea, nausea and vomiting.  Genitourinary: Negative for dysuria, vaginal bleeding, vaginal discharge and vaginal pain.  Musculoskeletal: Negative for back pain, neck pain and neck stiffness.  Skin: Negative for rash and wound.  Neurological: Negative for  dizziness, weakness, light-headedness, numbness and headaches.  Psychiatric/Behavioral: Negative for agitation and confusion.  All other systems reviewed and are negative.    Physical Exam Updated Vital Signs BP 122/84   Pulse (!) 118   Temp (!) 102.4 F (39.1 C) (Oral)   Resp 18   Ht 5\' 4"  (1.626 m)   Wt 76.2 kg (168 lb)   SpO2 100%   BMI 28.84 kg/m   Physical Exam  Constitutional: She is oriented to person, place, and time. She appears well-developed and well-nourished. No distress.  HENT:  Head: Normocephalic and atraumatic.  Mouth/Throat: Oropharynx is clear and moist. No oropharyngeal exudate.  Eyes: Conjunctivae and EOM are normal. Pupils are equal, round, and reactive to light.  Neck: Normal range of motion.  Cardiovascular: Intact distal pulses. Tachycardia present.  No murmur heard. Pulmonary/Chest: Effort normal and breath sounds normal. No respiratory distress. She has no wheezes. She has no rales. She exhibits no tenderness.  Abdominal: Soft. Bowel sounds are normal. She exhibits no distension. There is no tenderness.  Musculoskeletal: She exhibits no edema or tenderness.  Neurological: She is alert and oriented to person, place, and time. No sensory deficit. She exhibits normal muscle tone.  Skin: No rash noted. She is not diaphoretic. No erythema.  Psychiatric: She has a normal mood and affect.  Nursing note and vitals reviewed.    ED Treatments / Results  Labs (all labs ordered are listed, but only abnormal results are displayed) Labs Reviewed  URINALYSIS, ROUTINE W REFLEX MICROSCOPIC - Abnormal; Notable for the following components:      Result Value   Hgb urine dipstick MODERATE (*)    Ketones, ur 15 (*)    All other components within normal limits  URINALYSIS, MICROSCOPIC (REFLEX) - Abnormal; Notable for the following components:   Bacteria, UA RARE (*)    Squamous Epithelial / LPF 0-5 (*)    All other components within normal limits  COMPREHENSIVE  METABOLIC PANEL - Abnormal; Notable for the following components:   Sodium 130 (*)    Potassium 3.2 (*)    Glucose, Bld 105 (*)    Calcium 8.2 (*)    Alkaline Phosphatase 37 (*)    All other components within normal limits  URINE CULTURE  PREGNANCY, URINE  INFLUENZA PANEL BY PCR (TYPE A & B)  CBC WITH DIFFERENTIAL/PLATELET  CBC WITH DIFFERENTIAL/PLATELET    EKG  EKG Interpretation None       Radiology Dg Chest 2 View  Result Date: 02/23/2018 CLINICAL DATA:  Fever and cough EXAM: CHEST - 2 VIEW COMPARISON:  04/27/2017, CT 05/01/2017 FINDINGS: Minimal streaky atelectasis or infiltrate at the left lung base. No pleural effusion. Normal heart size. No pneumothorax. IMPRESSION: Mild streaky atelectasis or infiltrate at the left lung base. Electronically Signed   By: Jasmine Pang M.D.   On: 02/23/2018 20:22    Procedures Procedures (including  critical care time)  Medications Ordered in ED Medications  acetaminophen (TYLENOL) tablet 650 mg (650 mg Oral Given 02/23/18 1848)  sodium chloride 0.9 % bolus 1,000 mL (0 mLs Intravenous Stopped 02/24/18 0012)  sodium chloride 0.9 % bolus 1,000 mL (0 mLs Intravenous Stopped 02/24/18 0012)  levofloxacin (LEVAQUIN) tablet 750 mg (750 mg Oral Given 02/23/18 2144)     Initial Impression / Assessment and Plan / ED Course  I have reviewed the triage vital signs and the nursing notes.  Pertinent labs & imaging results that were available during my care of the patient were reviewed by me and considered in my medical decision making (see chart for details).      Frank Pilger is a 37 y.o. female with a past medical history significant for endometriosis, prior cholecystectomy, and hiatal hernia who presents with fevers, chills, congestion, cough, and rectal bleeding.  Patient reports that she is been having fevers and chills for the last few days but has had a cough for approximately 1 week.  She reports a productive cough with clear sputum.  She reports  she has had sick contacts with pneumonia.  She reports fever up to 102.4 on arrival.  She denies nausea, vomiting but does report that she has had a decreased oral intake.  She denies any urinary symptoms or GI symptoms.  She denies any vaginal discharge or vaginal bleeding but does report having some pain with bowel movement and some rectal bleeding on the toilet paper.  She does report that she has had hemorrhoid in the past.  On arrival, patient was tachycardic and febrile.  Patient was given Tylenol with improvement in fever.  Exam revealed evidence of a hemorrhoid that was minimally tender.  Suspect this is the cause of her bleeding.  Doubt GI bleed.  Lungs were clear and chest was nontender.  Patient was tachycardic.  Abdomen was nontender and patient had no focal neurologic deficits.  Patient appeared well and had normal oxygen saturation on room air.  Patient had workup to look for infection including pneumonia.  X-ray did show evidence of a streaky atelectasis versus infiltrate.  Given the patient's exposure to pneumonia and her productive cough with fever, patient given prescription and a dose of levofloxacin.  Patient had no evidence of UTI.  Electrolytes revealed slight hypokalemia which was corrected orally.  No leukocytosis or anemia.  Influenza test was negative.  Pregnancy test negative.  Patient felt better after some fluids.  Patient was able to tolerate eating and drinking.  Given patient's improved vital signs and normal oxygen saturation on room air, patient was not felt to need admission.  Patient will have her follow-up with her PCP and understood strict return precautions for new or worsened symptoms.  Patient given prescription for antibiotics.  Patient had no other questions or concerns and was discharged in good condition with improving symptoms.      Final Clinical Impressions(s) / ED Diagnoses   Final diagnoses:  Community acquired pneumonia, unspecified laterality  Cough    Fever, unspecified fever cause    ED Discharge Orders        Ordered    levofloxacin (LEVAQUIN) 750 MG tablet  Daily     02/24/18 0025    oseltamivir (TAMIFLU) 75 MG capsule  Every 12 hours     02/24/18 0025      Clinical Impression: 1. Community acquired pneumonia, unspecified laterality   2. Cough   3. Fever, unspecified fever cause  Disposition: Discharge  Condition: Good  I have discussed the results, Dx and Tx plan with the pt(& family if present). He/she/they expressed understanding and agree(s) with the plan. Discharge instructions discussed at great length. Strict return precautions discussed and pt &/or family have verbalized understanding of the instructions. No further questions at time of discharge.    Discharge Medication List as of 02/24/2018 12:26 AM    START taking these medications   Details  levofloxacin (LEVAQUIN) 750 MG tablet Take 1 tablet (750 mg total) by mouth daily for 5 days., Starting Fri 02/24/2018, Until Wed 03/01/2018, Print    oseltamivir (TAMIFLU) 75 MG capsule Take 1 capsule (75 mg total) by mouth every 12 (twelve) hours., Starting Fri 02/24/2018, Print        Follow Up: [facility] [address] [place] [id number] [phone] Schedule an appointment as soon as possible for a visit    [facility] [address] [id number] [address] [id number] [phone]       Tegeler, Canary Brimhristopher J, MD 02/24/18 [phone]

## 2018-02-23 NOTE — ED Notes (Signed)
Pt. Aware of plan of care

## 2018-02-23 NOTE — ED Triage Notes (Signed)
Body aches and chills x 2 days. Today she saw bright red blood from her rectum. States she has a "bump" that could be a hemorrhoid.

## 2018-02-24 LAB — INFLUENZA PANEL BY PCR (TYPE A & B)
Influenza A By PCR: NEGATIVE
Influenza B By PCR: NEGATIVE

## 2018-02-24 MED ORDER — OSELTAMIVIR PHOSPHATE 75 MG PO CAPS: 75.0000 mg | ORAL_CAPSULE | Freq: Two times a day (BID) | ORAL | 0 refills | Status: AC

## 2018-02-24 MED ORDER — LEVOFLOXACIN 750 MG PO TABS: 750.0000 mg | ORAL_TABLET | Freq: Every day | ORAL | 0 refills | Status: AC

## 2018-02-24 NOTE — Discharge Instructions (Signed)
Your workup today showed evidence of pneumonia.  Please take the antibiotics to treat the infection.  We also sent a flu test that had not resulted yet.  If you use my chart and see your flu test is positive, please fill the prescription for Tamiflu.  Please stay hydrated and follow-up with your primary doctor in several days.  If any symptoms change or worsen, please return to the nearest emergency department.

## 2018-02-25 LAB — URINE CULTURE

## 2018-11-22 ENCOUNTER — Emergency Department (HOSPITAL_BASED_OUTPATIENT_CLINIC_OR_DEPARTMENT_OTHER): Payer: Managed Care, Other (non HMO)

## 2018-11-22 ENCOUNTER — Emergency Department (HOSPITAL_BASED_OUTPATIENT_CLINIC_OR_DEPARTMENT_OTHER)
Admission: EM | Admit: 2018-11-22 | Discharge: 2018-11-22 | Disposition: A | Discharge status: Home / Self Care | Payer: Managed Care, Other (non HMO) | Attending: Emergency Medicine | Admitting: Emergency Medicine

## 2018-11-22 ENCOUNTER — Other Ambulatory Visit: Payer: Self-pay

## 2018-11-22 ENCOUNTER — Encounter (HOSPITAL_BASED_OUTPATIENT_CLINIC_OR_DEPARTMENT_OTHER): Payer: Self-pay

## 2018-11-22 DIAGNOSIS — Z79899 Other long term (current) drug therapy: Secondary | ICD-10-CM | POA: Insufficient documentation

## 2018-11-22 DIAGNOSIS — J111 Influenza due to unidentified influenza virus with other respiratory manifestations: Secondary | ICD-10-CM | POA: Insufficient documentation

## 2018-11-22 DIAGNOSIS — R69 Illness, unspecified: Secondary | ICD-10-CM

## 2018-11-22 DIAGNOSIS — R05 Cough: Secondary | ICD-10-CM | POA: Diagnosis present

## 2018-11-22 LAB — COMPREHENSIVE METABOLIC PANEL
ALT: 33 U/L (ref 0–44)
AST: 26 U/L (ref 15–41)
Albumin: 3.8 g/dL (ref 3.5–5.0)
Alkaline Phosphatase: 32 U/L — ABNORMAL LOW (ref 38–126)
Anion gap: 8 (ref 5–15)
BUN: 11 mg/dL (ref 6–20)
CO2: 25 mmol/L (ref 22–32)
Calcium: 8.6 mg/dL — ABNORMAL LOW (ref 8.9–10.3)
Chloride: 105 mmol/L (ref 98–111)
Creatinine, Ser: 0.94 mg/dL (ref 0.44–1.00)
GFR calc Af Amer: >60 mL/min (ref >60)
GFR calc non Af Amer: >60 mL/min (ref >60)
Glucose, Bld: 93 mg/dL (ref 70–99)
Potassium: 3.3 mmol/L — ABNORMAL LOW (ref 3.5–5.1)
Sodium: 138 mmol/L (ref 135–145)
Total Bilirubin: 0.6 mg/dL (ref 0.3–1.2)
Total Protein: 7.5 g/dL (ref 6.5–8.1)

## 2018-11-22 LAB — URINALYSIS, ROUTINE W REFLEX MICROSCOPIC
Glucose, UA: NEGATIVE mg/dL
Ketones, ur: 15 mg/dL — AB
Leukocytes, UA: NEGATIVE
Nitrite: NEGATIVE
Protein, ur: NEGATIVE mg/dL
Specific Gravity, Urine: 1.02 (ref 1.005–1.030)
pH: 6 (ref 5.0–8.0)

## 2018-11-22 LAB — CBC WITH DIFFERENTIAL/PLATELET
Abs Immature Granulocytes: 0.01 10*3/uL (ref 0.00–0.07)
Basophils Absolute: 0 10*3/uL (ref 0.0–0.1)
Basophils Relative: 0 %
Eosinophils Absolute: 0.1 10*3/uL (ref 0.0–0.5)
Eosinophils Relative: 1 %
HCT: 40.6 % (ref 36.0–46.0)
Hemoglobin: 13.2 g/dL (ref 12.0–15.0)
Immature Granulocytes: 0 %
Lymphocytes Relative: 38 %
Lymphs Abs: 2 10*3/uL (ref 0.7–4.0)
MCH: 26.9 pg (ref 26.0–34.0)
MCHC: 32.5 g/dL (ref 30.0–36.0)
MCV: 82.9 fL (ref 80.0–100.0)
Monocytes Absolute: 0.5 10*3/uL (ref 0.1–1.0)
Monocytes Relative: 9 %
Neutro Abs: 2.7 10*3/uL (ref 1.7–7.7)
Neutrophils Relative %: 52 %
Platelets: 274 10*3/uL (ref 150–400)
RBC: 4.9 MIL/uL (ref 3.87–5.11)
RDW: 13.4 % (ref 11.5–15.5)
WBC: 5.2 10*3/uL (ref 4.0–10.5)
nRBC: 0 % (ref 0.0–0.2)

## 2018-11-22 LAB — URINALYSIS, MICROSCOPIC (REFLEX): WBC, UA: NONE SEEN WBC/hpf (ref 0–5)

## 2018-11-22 LAB — PREGNANCY, URINE: Preg Test, Ur: NEGATIVE

## 2018-11-22 LAB — LIPASE, BLOOD: Lipase: 40 U/L (ref 11–51)

## 2018-11-22 MED ORDER — POTASSIUM CHLORIDE CRYS ER 20 MEQ PO TBCR
EXTENDED_RELEASE_TABLET | ORAL | Status: AC
  Filled 2018-11-22: qty 2

## 2018-11-22 MED ORDER — CETIRIZINE-PSEUDOEPHEDRINE ER 5-120 MG PO TB12: 1.0000 | ORAL_TABLET | Freq: Two times a day (BID) | ORAL | 0 refills | Status: AC

## 2018-11-22 MED ORDER — ACETAMINOPHEN 500 MG PO TABS: 500.0000 mg | ORAL_TABLET | Freq: Four times a day (QID) | ORAL | 0 refills | Status: AC | PRN

## 2018-11-22 MED ORDER — ONDANSETRON HCL 4 MG PO TABS: 4.0000 mg | ORAL_TABLET | Freq: Four times a day (QID) | ORAL | 0 refills | Status: AC

## 2018-11-22 MED ORDER — POTASSIUM CHLORIDE CRYS ER 20 MEQ PO TBCR
40.0000 meq | EXTENDED_RELEASE_TABLET | Freq: Once | ORAL | Status: AC
  Administered 2018-11-22: 40 meq via ORAL

## 2018-11-22 MED ORDER — BENZONATATE 100 MG PO CAPS: 100.0000 mg | ORAL_CAPSULE | Freq: Three times a day (TID) | ORAL | 0 refills | Status: AC

## 2018-11-22 MED ORDER — IBUPROFEN 600 MG PO TABS: 600.0000 mg | ORAL_TABLET | Freq: Four times a day (QID) | ORAL | 0 refills | Status: AC | PRN

## 2018-11-22 MED ORDER — SODIUM CHLORIDE 0.9 % IV BOLUS
1000.0000 mL | Freq: Once | INTRAVENOUS | Status: AC
  Administered 2018-11-22: 1000 mL via INTRAVENOUS

## 2018-11-22 MED ORDER — ONDANSETRON HCL 4 MG/2ML IJ SOLN
4.0000 mg | Freq: Once | INTRAMUSCULAR | Status: AC
  Administered 2018-11-22: 4 mg via INTRAVENOUS
  Filled 2018-11-22: qty 2

## 2018-11-22 NOTE — ED Provider Notes (Signed)
[facility] EMERGENCY DEPARTMENT Provider Note   CSN: [id number] Arrival date & time: 11/22/18  1645     History   Chief Complaint Chief Complaint  Patient presents with  . Cough    HPI Lauren Drake is a 37 y.o. female with history of endometriosis who presents with a one-week history of cough, fever, nausea, vomiting, nasal congestion.  Patient reports she has not been able to keep food down for the past week.  She has had emesis after coughing fits, but also anytime she tries to eat anything.  She has been trying to keep fluids down, but intermittently vomits that as well.  He has had associated body aches.  She has had fever up to 101.  She is now started to have chest pain and abdominal spasms when she coughs.  She denies any other abdominal pain.  She has had an odor to her urine.  She is concerned she is dehydrated.  She is very fatigued and achy all over.  HPI  Past Medical History:  Diagnosis Date  . Endometriosis   . History of hiatal hernia   . Miscarriage   . Pregnant and not yet delivered in third trimester     There are no active problems to display for this patient.   Past Surgical History:  Procedure Laterality Date  . DILATION AND CURETTAGE OF UTERUS    . HERNIA REPAIR    . WISDOM TOOTH EXTRACTION Bilateral      OB History    Gravida  4   Para  2   Term  2   Preterm      AB  1   Living  2     SAB  1   TAB      Ectopic      Multiple      Live Births               Home Medications    Prior to Admission medications   Medication Sig Start Date End Date Taking? Authorizing Provider  acetaminophen (TYLENOL) 500 MG tablet Take 1 tablet (500 mg total) by mouth every 6 (six) hours as needed. 11/22/18   Carolann Brazell, Waylan Boga, PA-C  benzonatate (TESSALON) 100 MG capsule Take 1 capsule (100 mg total) by mouth every 8 (eight) hours. 11/22/18   Jheremy Boger, Waylan Boga, PA-C  cetirizine-pseudoephedrine (ZYRTEC-D) 5-120 MG tablet Take 1 tablet  by mouth 2 (two) times daily. 11/22/18   Mishell Donalson, Waylan Boga, PA-C  cyclobenzaprine (FLEXERIL) 10 MG tablet Take 1 tablet (10 mg total) by mouth 2 (two) times daily as needed for muscle spasms. 05/01/17   Alvira Monday, MD  diphenhydrAMINE (BENADRYL) 25 MG tablet Take 1 tablet (25 mg total) by mouth every 6 (six) hours as needed for itching (Rash). 03/27/16   Everlene Farrier, PA-C  Doxylamine-Pyridoxine 10-10 MG TBEC Take 1 tablet by mouth 2 times daily at 12 noon and 4 pm. Patient not taking: Reported on 04/14/2015 04/14/15   Palumbo, April, MD  ibuprofen (ADVIL,MOTRIN) 600 MG tablet Take 1 tablet (600 mg total) by mouth every 6 (six) hours as needed. 11/22/18   Analeigh Aries, Waylan Boga, PA-C  ondansetron (ZOFRAN) 4 MG tablet Take 1 tablet (4 mg total) by mouth every 6 (six) hours. 11/22/18   Winson Eichorn, Waylan Boga, PA-C  oseltamivir (TAMIFLU) 75 MG capsule Take 1 capsule (75 mg total) by mouth every 12 (twelve) hours. 02/24/18   Tegeler, Canary Brim, MD  predniSONE (DELTASONE) 20 MG tablet  Take 2 tablets (40 mg total) by mouth daily. 03/27/16   Everlene Farrieransie, William, PA-C  Prenatal Vit-Fe Fumarate-FA (PRENATAL MULTIVITAMIN) TABS tablet Take 1 tablet by mouth daily at 12 noon.    [provider]  promethazine (PHENERGAN) 25 MG tablet Take 0.5-1 tablets (12.5-25 mg total) by mouth every 6 (six) hours as needed. 04/14/15   Armando ReichertHogan, Heather D, CNM  ranitidine (ZANTAC) 150 MG capsule Take 1 capsule (150 mg total) by mouth daily. 03/27/16   Everlene Farrieransie, William, PA-C    Family History No family history on file.  Social History Social History   Tobacco Use  . Smoking status: Never Smoker  . Smokeless tobacco: Never Used  Substance Use Topics  . Alcohol use: No  . Drug use: No     Allergies   Metronidazole   Review of Systems Review of Systems  Constitutional: Positive for fatigue and fever. Negative for chills.  HENT: Negative for facial swelling and sore throat.   Respiratory: Positive for cough. Negative for  shortness of breath.   Cardiovascular: Positive for chest pain.  Gastrointestinal: Positive for nausea and vomiting. Negative for abdominal pain, blood in stool and diarrhea.  Genitourinary: Negative for dysuria.  Musculoskeletal: Positive for myalgias. Negative for back pain.  Skin: Negative for rash and wound.  Neurological: Positive for headaches.  Psychiatric/Behavioral: The patient is not nervous/anxious.      Physical Exam Updated Vital Signs BP 108/81 (BP Location: Left Arm)   Pulse 87   Temp 98.3 F (36.8 C) (Oral)   Resp 18   Ht 5\' 4"  (1.626 m)   Wt 83.3 kg   LMP 11/11/2018   SpO2 98%   BMI 31.53 kg/m   Physical Exam  Constitutional: She appears well-developed and well-nourished. No distress.  HENT:  Head: Normocephalic and atraumatic.  Mouth/Throat: Oropharynx is clear and moist. No oropharyngeal exudate.  Eyes: Pupils are equal, round, and reactive to light. Conjunctivae are normal. Right eye exhibits no discharge. Left eye exhibits no discharge. No scleral icterus.  Neck: Normal range of motion. Neck supple. No thyromegaly present.  Cardiovascular: Normal rate, regular rhythm, normal heart sounds and intact distal pulses. Exam reveals no gallop and no friction rub.  No murmur heard. Pulmonary/Chest: Effort normal and breath sounds normal. No stridor. No respiratory distress. She has no wheezes. She has no rales.  Abdominal: Soft. Bowel sounds are normal. She exhibits no distension. There is no tenderness. There is no rebound and no guarding.  Musculoskeletal: She exhibits no edema.  Lymphadenopathy:    She has no cervical adenopathy.  Neurological: She is alert. Coordination normal.  Skin: Skin is warm and dry. No rash noted. She is not diaphoretic. No pallor.  Psychiatric: She has a normal mood and affect.  Nursing note and vitals reviewed.    ED Treatments / Results  Labs (all labs ordered are listed, but only abnormal results are displayed) Labs  Reviewed  COMPREHENSIVE METABOLIC PANEL - Abnormal; Notable for the following components:      Result Value   Potassium 3.3 (*)    Calcium 8.6 (*)    Alkaline Phosphatase 32 (*)    All other components within normal limits  URINALYSIS, ROUTINE W REFLEX MICROSCOPIC - Abnormal; Notable for the following components:   APPearance CLOUDY (*)    Hgb urine dipstick MODERATE (*)    Bilirubin Urine SMALL (*)    Ketones, ur 15 (*)    All other components within normal limits  URINALYSIS, MICROSCOPIC (REFLEX) -  Abnormal; Notable for the following components:   Bacteria, UA RARE (*)    All other components within normal limits  CBC WITH DIFFERENTIAL/PLATELET  PREGNANCY, URINE  LIPASE, BLOOD    EKG None  Radiology Dg Chest 2 View  Result Date: 11/22/2018 CLINICAL DATA:  37 year old female with history of cough, fever and dehydration for 1 week. EXAM: CHEST - 2 VIEW COMPARISON:  Chest x-ray 02/23/2018. FINDINGS: Lung volumes are normal. No consolidative airspace disease. No pleural effusions. No pneumothorax. No pulmonary nodule or mass noted. Pulmonary vasculature and the cardiomediastinal silhouette are within normal limits. Soft tissue anchors for surgical mesh seen projecting over the upper abdomen. Surgical clips in the right upper quadrant of the abdomen, suggestive of prior cholecystectomy. IMPRESSION: No radiographic evidence of acute cardiopulmonary disease. Electronically Signed   By: Trudie Reed M.D.   On: 11/22/2018 19:00    Procedures Procedures (including critical care time)  Medications Ordered in ED Medications  sodium chloride 0.9 % bolus 1,000 mL (0 mLs Intravenous Stopped 11/22/18 1845)  ondansetron (ZOFRAN) injection 4 mg (4 mg Intravenous Given 11/22/18 1725)  potassium chloride SA (K-DUR,KLOR-CON) CR tablet 40 mEq (40 mEq Oral Given 11/22/18 1922)     Initial Impression / Assessment and Plan / ED Course  I have reviewed the triage vital signs and the nursing  notes.  Pertinent labs & imaging results that were available during my care of the patient were reviewed by me and considered in my medical decision making (see chart for details).     Patient with influenza-like illness.  Labs are unremarkable except for mild hypokalemia, which was replaced in the ED.  Chest x-ray is clear.  UA does show moderate hematuria, small bilirubin, 15 ketones.  Patient is on menstrual cycle.  Suspect due to dehydration.  Patient advised to follow-up with PCP for recheck of this to make sure it is cleared after illness.  Patient given IV fluids.  She is feeling better.  Will treat symptomatically.  Follow-up to PCP if symptoms are not improving and also for recheck as above.  Return precautions discussed.  Patient understands and agrees with plan.  Patient vitals stable throughout ED course and discharged in satisfactory condition.  Final Clinical Impressions(s) / ED Diagnoses   Final diagnoses:  Influenza-like illness    ED Discharge Orders         Ordered    benzonatate (TESSALON) 100 MG capsule  Every 8 hours     11/22/18 2036    ibuprofen (ADVIL,MOTRIN) 600 MG tablet  Every 6 hours PRN     11/22/18 2036    acetaminophen (TYLENOL) 500 MG tablet  Every 6 hours PRN     11/22/18 2036    cetirizine-pseudoephedrine (ZYRTEC-D) 5-120 MG tablet  2 times daily     11/22/18 2036    ondansetron (ZOFRAN) 4 MG tablet  Every 6 hours     11/22/18 2036           Emi Holes, PA-C 11/23/18 0004    Maia Plan, MD 11/23/18 1126

## 2018-11-22 NOTE — ED Notes (Signed)
Patient transported to X-ray 

## 2018-11-22 NOTE — ED Triage Notes (Signed)
Pt c/o flu like sx x 3 days-NAD-steady gait ?

## 2018-11-22 NOTE — ED Notes (Signed)
ED Provider at bedside. 

## 2018-11-22 NOTE — Discharge Instructions (Addendum)
Take Tessalon every 8 hours as needed for cough.  Alternate ibuprofen and Tylenol as prescribed for body aches and fever.  Take Zyrtec-D for nasal congestion.  Take Zofran every 6 hours as needed for nausea or vomiting.  Please return the emergency department he develop any new or worsening symptoms.  Please follow-up with your doctor for recheck of your urine, as there was blood in it today.  This could be from dehydration, but needs to be checked to make sure it has cleared.

## 2019-03-27 IMAGING — DX DG CHEST 2V
2 series · 2 of 2 positions shown · non-contrast
Comparison: 04/27/2017, CT 05/01/2017

CLINICAL DATA: Fever and cough

EXAM:
CHEST - 2 VIEW

[chest pa]
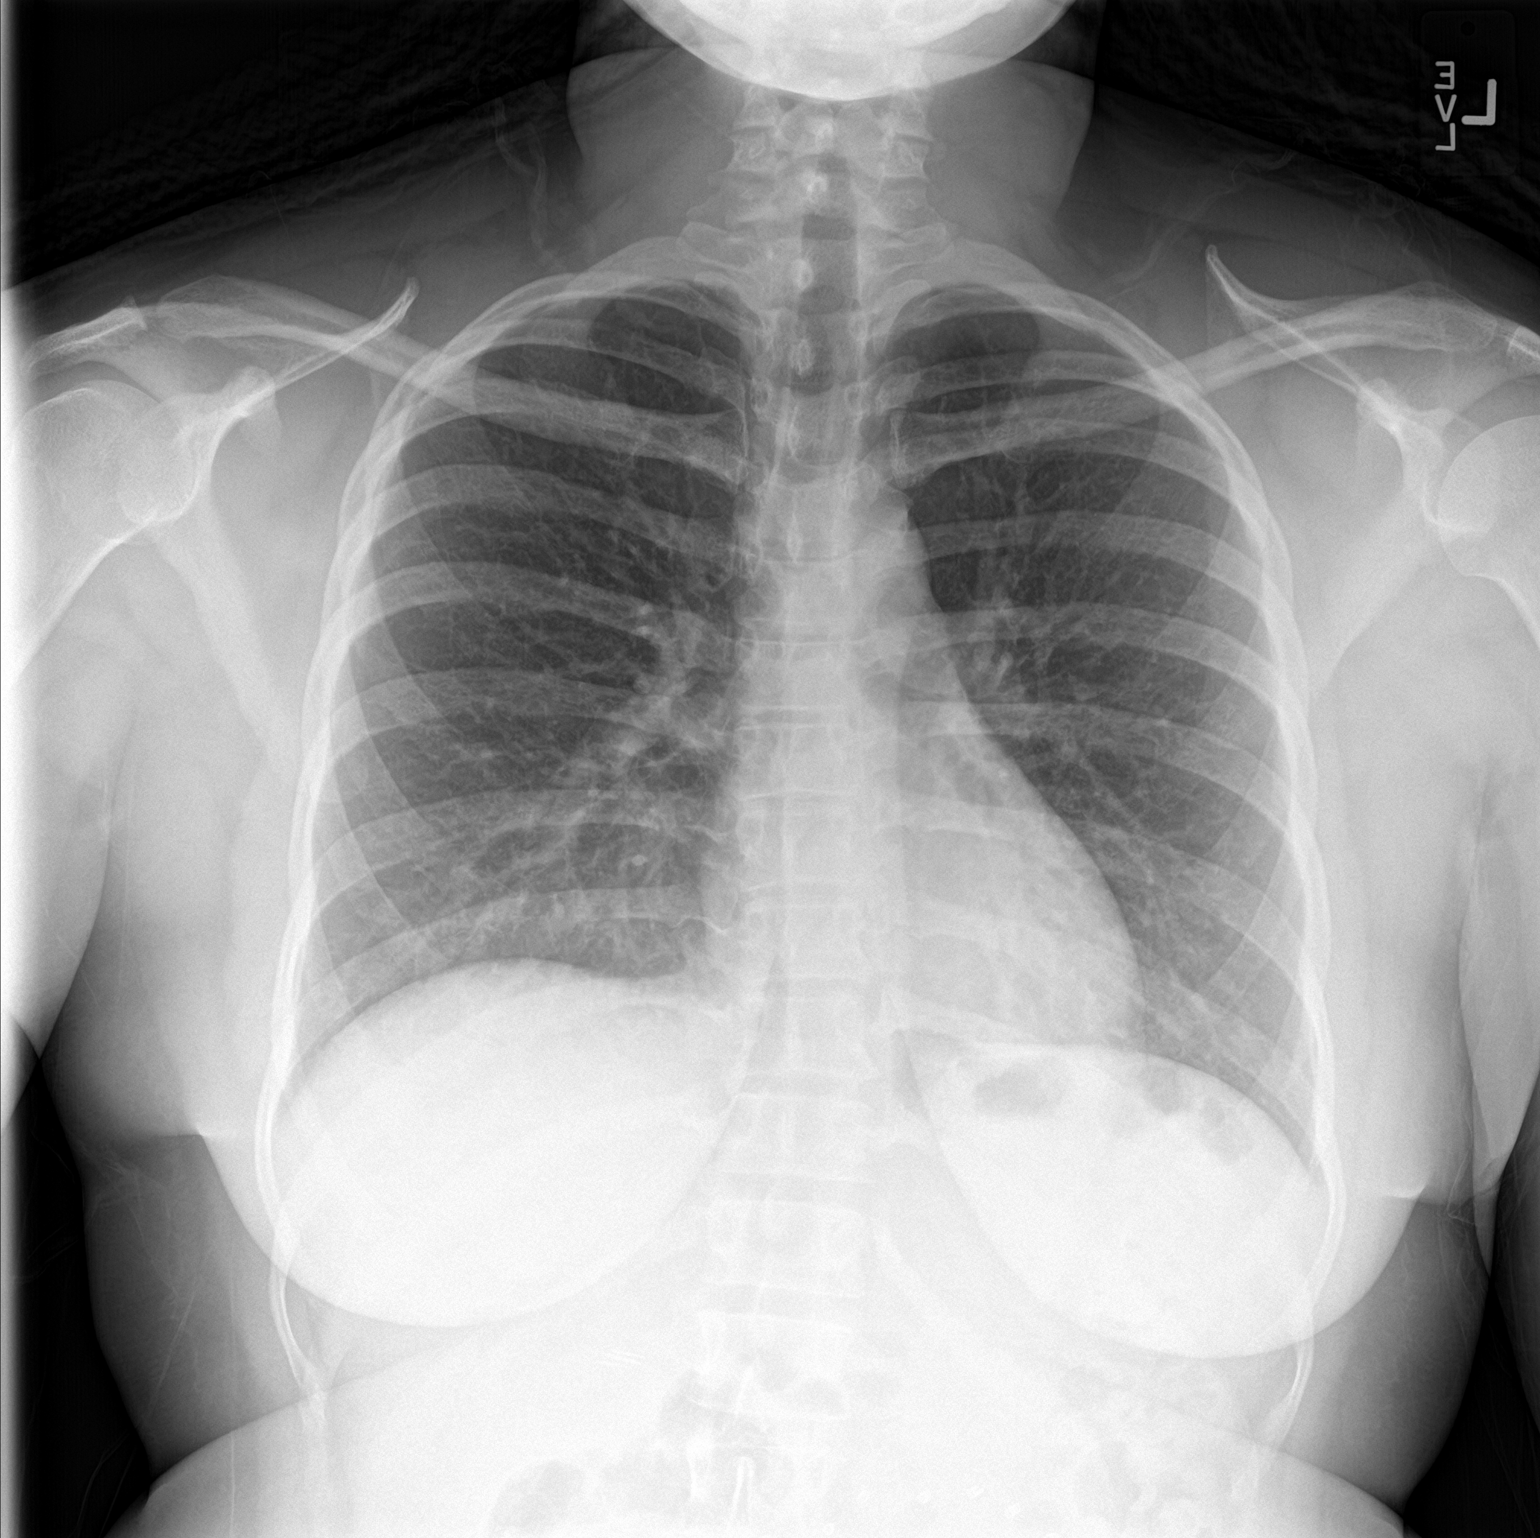

[chest lat]
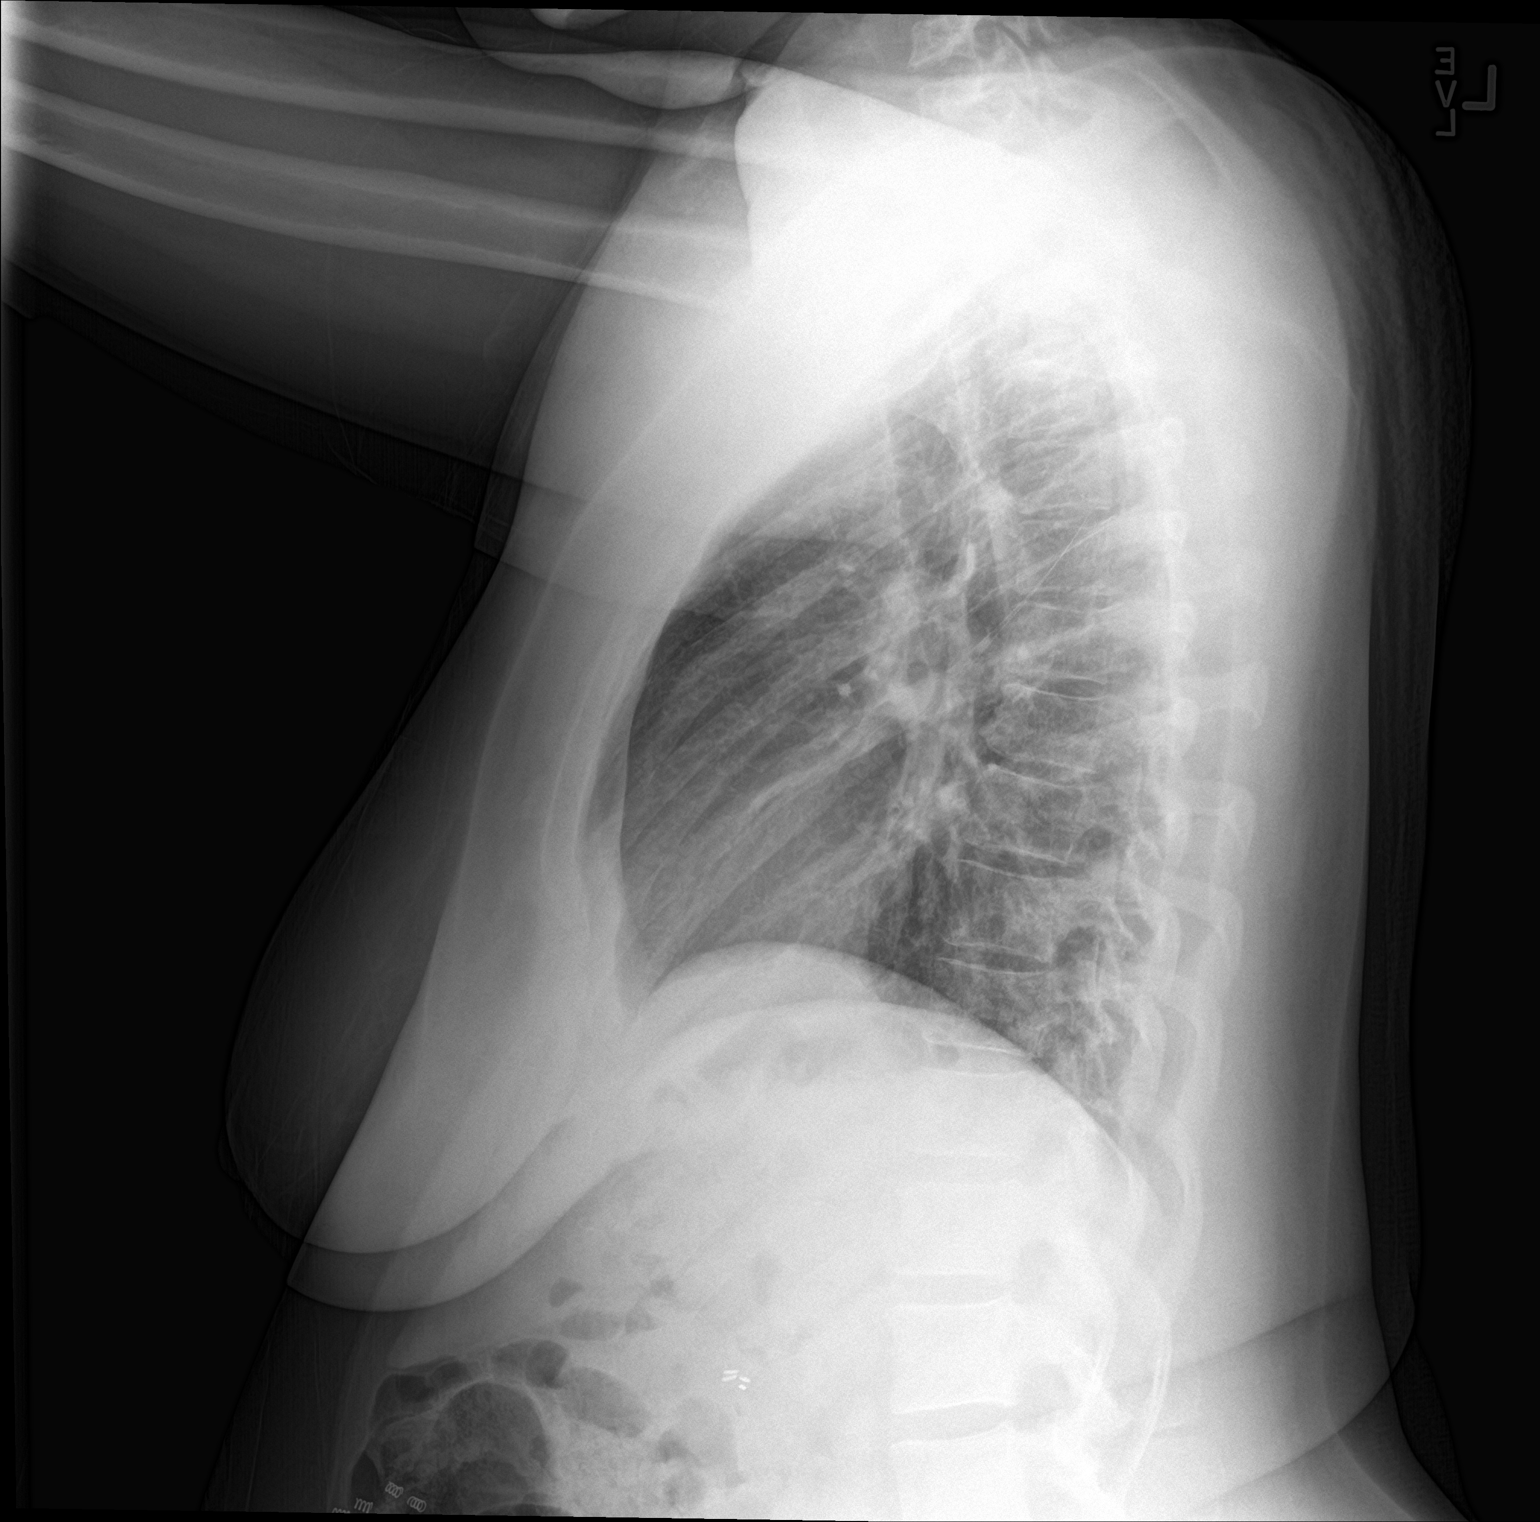

[2 of 2 positions shown; findings below may reference images not displayed]

FINDINGS: Minimal streaky atelectasis or infiltrate at the left lung base. No
pleural effusion. Normal heart size. No pneumothorax.
IMPRESSION: Mild streaky atelectasis or infiltrate at the left lung base.

## 2020-11-06 ENCOUNTER — Emergency Department (HOSPITAL_BASED_OUTPATIENT_CLINIC_OR_DEPARTMENT_OTHER)
Admission: EM | Admit: 2020-11-06 | Discharge: 2020-11-06 | Disposition: A | Discharge status: Home / Self Care | Payer: 59 | Attending: Emergency Medicine | Admitting: Emergency Medicine

## 2020-11-06 ENCOUNTER — Encounter (HOSPITAL_BASED_OUTPATIENT_CLINIC_OR_DEPARTMENT_OTHER): Payer: Self-pay | Admitting: *Deleted

## 2020-11-06 ENCOUNTER — Emergency Department (HOSPITAL_BASED_OUTPATIENT_CLINIC_OR_DEPARTMENT_OTHER): Payer: 59

## 2020-11-06 ENCOUNTER — Other Ambulatory Visit: Payer: Self-pay

## 2020-11-06 DIAGNOSIS — Y9289 Other specified places as the place of occurrence of the external cause: Secondary | ICD-10-CM | POA: Insufficient documentation

## 2020-11-06 DIAGNOSIS — S86911A Strain of unspecified muscle(s) and tendon(s) at lower leg level, right leg, initial encounter: Secondary | ICD-10-CM | POA: Diagnosis not present

## 2020-11-06 DIAGNOSIS — S8991XA Unspecified injury of right lower leg, initial encounter: Secondary | ICD-10-CM | POA: Diagnosis present

## 2020-11-06 DIAGNOSIS — W19XXXA Unspecified fall, initial encounter: Secondary | ICD-10-CM

## 2020-11-06 DIAGNOSIS — X501XXA Overexertion from prolonged static or awkward postures, initial encounter: Secondary | ICD-10-CM | POA: Diagnosis not present

## 2020-11-06 MED ORDER — DICLOFENAC SODIUM 75 MG PO TBEC: 75.0000 mg | DELAYED_RELEASE_TABLET | Freq: Two times a day (BID) | ORAL | 0 refills | Status: AC

## 2020-11-06 NOTE — ED Provider Notes (Addendum)
[facility] EMERGENCY DEPARTMENT Provider Note   CSN: [id number] Arrival date & time: 11/06/20  1743     History Chief Complaint  Patient presents with  . Knee Pain    Lauren Drake is a 39 y.o. female.  The history is provided by the patient. No language interpreter was used.  Knee Pain Location:  Knee Time since incident:  1 day Injury: yes   Knee location:  R knee Pain details:    Quality:  Aching   Radiates to:  Does not radiate   Severity:  Moderate   Onset quality:  Gradual   Timing:  Constant   Progression:  Worsening Chronicity:  New Dislocation: no   Foreign body present:  No foreign bodies Relieved by:  Nothing Worsened by:  Nothing Ineffective treatments:  None tried Risk factors: no known bone disorder   Pt reports she twisted her leg.  Pt complains of pain in the back of her leg.      Past Medical History:  Diagnosis Date  . History of hiatal hernia   . Miscarriage   . Pregnant and not yet delivered in third trimester     There are no problems to display for this patient.   Past Surgical History:  Procedure Laterality Date  . DILATION AND CURETTAGE OF UTERUS    . HERNIA REPAIR    . WISDOM TOOTH EXTRACTION Bilateral      OB History    Gravida  4   Para  3   Term  3   Preterm      AB  1   Living  3     SAB  1   TAB      Ectopic      Multiple      Live Births              History reviewed. No pertinent family history.  Social History   Tobacco Use  . Smoking status: Never Smoker  . Smokeless tobacco: Never Used  Vaping Use  . Vaping Use: Never used  Substance Use Topics  . Alcohol use: No  . Drug use: No    Home Medications Prior to Admission medications   Medication Sig Start Date End Date Taking? Authorizing Provider  acetaminophen (TYLENOL) 500 MG tablet Take 1 tablet (500 mg total) by mouth every 6 (six) hours as needed. 11/22/18   Law, Waylan Boga, PA-C  benzonatate (TESSALON) 100 MG  capsule Take 1 capsule (100 mg total) by mouth every 8 (eight) hours. 11/22/18   Law, Waylan Boga, PA-C  cetirizine-pseudoephedrine (ZYRTEC-D) 5-120 MG tablet Take 1 tablet by mouth 2 (two) times daily. 11/22/18   Law, Waylan Boga, PA-C  cyclobenzaprine (FLEXERIL) 10 MG tablet Take 1 tablet (10 mg total) by mouth 2 (two) times daily as needed for muscle spasms. 05/01/17   Alvira Monday, MD  diphenhydrAMINE (BENADRYL) 25 MG tablet Take 1 tablet (25 mg total) by mouth every 6 (six) hours as needed for itching (Rash). 03/27/16   Everlene Farrier, PA-C  Doxylamine-Pyridoxine 10-10 MG TBEC Take 1 tablet by mouth 2 times daily at 12 noon and 4 pm. Patient not taking: Reported on 04/14/2015 04/14/15   Palumbo, April, MD  ibuprofen (ADVIL,MOTRIN) 600 MG tablet Take 1 tablet (600 mg total) by mouth every 6 (six) hours as needed. 11/22/18   Law, Waylan Boga, PA-C  ondansetron (ZOFRAN) 4 MG tablet Take 1 tablet (4 mg total) by mouth every 6 (six) hours. 11/22/18  Law, Waylan Boga, PA-C  oseltamivir (TAMIFLU) 75 MG capsule Take 1 capsule (75 mg total) by mouth every 12 (twelve) hours. 02/24/18   Tegeler, Canary Brim, MD  predniSONE (DELTASONE) 20 MG tablet Take 2 tablets (40 mg total) by mouth daily. 03/27/16   Everlene Farrier, PA-C  Prenatal Vit-Fe Fumarate-FA (PRENATAL MULTIVITAMIN) TABS tablet Take 1 tablet by mouth daily at 12 noon.    [provider]  promethazine (PHENERGAN) 25 MG tablet Take 0.5-1 tablets (12.5-25 mg total) by mouth every 6 (six) hours as needed. 04/14/15   Armando Reichert, CNM  ranitidine (ZANTAC) 150 MG capsule Take 1 capsule (150 mg total) by mouth daily. 03/27/16   Everlene Farrier, PA-C    Allergies    Metronidazole  Review of Systems   Review of Systems  All other systems reviewed and are negative.   Physical Exam Updated Vital Signs BP 111/87 (BP Location: Right Arm)   Pulse (!) 121   Temp 98.8 F (37.1 C) (Oral)   Resp 16   Ht 5\' 4"  (1.626 m)   Wt 72.6 kg   LMP  10/30/2020   SpO2 100%   BMI 27.46 kg/m   Physical Exam Vitals and nursing note reviewed.  Constitutional:      Appearance: She is well-developed.  HENT:     Head: Normocephalic.  Cardiovascular:     Rate and Rhythm: Normal rate.  Pulmonary:     Effort: Pulmonary effort is normal.  Abdominal:     General: There is no distension.  Musculoskeletal:        General: Swelling present. Normal range of motion.     Comments: Tender right posterior knee,  Pain with movement,  No medial or lateral instability nv and ns intact  Skin:    General: Skin is warm.  Neurological:     General: No focal deficit present.     Mental Status: She is alert and oriented to person, place, and time.  Psychiatric:        Mood and Affect: Mood normal.     ED Results / Procedures / Treatments   Labs (all labs ordered are listed, but only abnormal results are displayed) Labs Reviewed - No data to display  EKG None  Radiology DG Knee Complete 4 Views Right  Result Date: 11/06/2020 CLINICAL DATA:  Pain following fall EXAM: RIGHT KNEE - COMPLETE 4+ VIEW COMPARISON:  None. FINDINGS: Frontal, lateral, and bilateral oblique views were obtained. No fracture or dislocation. No appreciable joint effusion. Joint spaces appear normal. No erosive change. IMPRESSION: No appreciable fracture, dislocation, or joint effusion. No appreciable arthropathy. Electronically Signed   By: 11/08/2020 III M.D.   On: 11/06/2020 18:52    Procedures Procedures (including critical care time)  Medications Ordered in ED Medications - No data to display  ED Course  I have reviewed the triage vital signs and the nursing notes.  Pertinent labs & imaging results that were available during my care of the patient were reviewed by me and considered in my medical decision making (see chart for details).    MDM Rules/Calculators/A&P                         MDM: I suspect pt has a muscle strain.  Pain more in muscles of  back of knee. Xray no fracture  An After Visit Summary was printed and given to the patient.  Final Clinical Impression(s) / ED Diagnoses Final diagnoses:  Fall  Strain of right knee, initial encounter    Rx / DC Orders ED Discharge Orders         Ordered    diclofenac (VOLTAREN) 75 MG EC tablet  2 times daily        11/06/20 1904        An After Visit Summary was printed and given to the patient.   Elson Areas, PA-C 11/06/20 1905    Elson Areas, PA-C 11/06/20 1920    Charlynne Pander, MD 11/06/20 2035

## 2020-11-06 NOTE — ED Triage Notes (Signed)
Patient missed a step on her stair and twisted her right leg this morning.  Right knee has been hurting since then.

## 2020-12-31 ENCOUNTER — Emergency Department (HOSPITAL_BASED_OUTPATIENT_CLINIC_OR_DEPARTMENT_OTHER)
Admission: EM | Admit: 2020-12-31 | Discharge: 2020-12-31 | Disposition: A | Discharge status: Home / Self Care | Payer: 59 | Attending: Emergency Medicine | Admitting: Emergency Medicine

## 2020-12-31 ENCOUNTER — Other Ambulatory Visit: Payer: Self-pay

## 2020-12-31 ENCOUNTER — Encounter (HOSPITAL_BASED_OUTPATIENT_CLINIC_OR_DEPARTMENT_OTHER): Payer: Self-pay | Admitting: *Deleted

## 2020-12-31 DIAGNOSIS — K625 Hemorrhage of anus and rectum: Secondary | ICD-10-CM | POA: Diagnosis not present

## 2020-12-31 DIAGNOSIS — Z5321 Procedure and treatment not carried out due to patient leaving prior to being seen by health care provider: Secondary | ICD-10-CM | POA: Insufficient documentation

## 2020-12-31 LAB — CBC WITH DIFFERENTIAL/PLATELET
Abs Immature Granulocytes: 0.01 10*3/uL (ref 0.00–0.07)
Basophils Absolute: 0 10*3/uL (ref 0.0–0.1)
Basophils Relative: 1 %
Eosinophils Absolute: 0 10*3/uL (ref 0.0–0.5)
Eosinophils Relative: 0 %
HCT: 42.1 % (ref 36.0–46.0)
Hemoglobin: 14.1 g/dL (ref 12.0–15.0)
Immature Granulocytes: 0 %
Lymphocytes Relative: 29 %
Lymphs Abs: 1.6 10*3/uL (ref 0.7–4.0)
MCH: 29 pg (ref 26.0–34.0)
MCHC: 33.5 g/dL (ref 30.0–36.0)
MCV: 86.4 fL (ref 80.0–100.0)
Monocytes Absolute: 0.4 10*3/uL (ref 0.1–1.0)
Monocytes Relative: 7 %
Neutro Abs: 3.4 10*3/uL (ref 1.7–7.7)
Neutrophils Relative %: 63 %
Platelets: 309 10*3/uL (ref 150–400)
RBC: 4.87 MIL/uL (ref 3.87–5.11)
RDW: 12.5 % (ref 11.5–15.5)
WBC: 5.4 10*3/uL (ref 4.0–10.5)
nRBC: 0 % (ref 0.0–0.2)

## 2020-12-31 LAB — URINALYSIS, ROUTINE W REFLEX MICROSCOPIC
Bilirubin Urine: NEGATIVE
Glucose, UA: NEGATIVE mg/dL
Ketones, ur: NEGATIVE mg/dL
Nitrite: NEGATIVE
Protein, ur: NEGATIVE mg/dL
Specific Gravity, Urine: 1.03 (ref 1.005–1.030)
pH: 6 (ref 5.0–8.0)

## 2020-12-31 LAB — URINALYSIS, MICROSCOPIC (REFLEX)

## 2020-12-31 LAB — PREGNANCY, URINE: Preg Test, Ur: NEGATIVE

## 2020-12-31 LAB — LIPASE, BLOOD: Lipase: 35 U/L (ref 11–51)

## 2020-12-31 LAB — COMPREHENSIVE METABOLIC PANEL
ALT: 14 U/L (ref 0–44)
AST: 14 U/L — ABNORMAL LOW (ref 15–41)
Albumin: 4 g/dL (ref 3.5–5.0)
Alkaline Phosphatase: 31 U/L — ABNORMAL LOW (ref 38–126)
Anion gap: 8 (ref 5–15)
BUN: 12 mg/dL (ref 6–20)
CO2: 28 mmol/L (ref 22–32)
Calcium: 9.5 mg/dL (ref 8.9–10.3)
Chloride: 104 mmol/L (ref 98–111)
Creatinine, Ser: 1.05 mg/dL — ABNORMAL HIGH (ref 0.44–1.00)
GFR, Estimated: >60 mL/min (ref >60)
Glucose, Bld: 107 mg/dL — ABNORMAL HIGH (ref 70–99)
Potassium: 4.1 mmol/L (ref 3.5–5.1)
Sodium: 140 mmol/L (ref 135–145)
Total Bilirubin: 0.7 mg/dL (ref 0.3–1.2)
Total Protein: 7.5 g/dL (ref 6.5–8.1)

## 2020-12-31 NOTE — ED Notes (Signed)
Called again from lobby to vending area.  No answer received.

## 2020-12-31 NOTE — ED Triage Notes (Signed)
C/o bright red rectal bleeding x 1 episode

## 2024-01-15 ENCOUNTER — Encounter (HOSPITAL_BASED_OUTPATIENT_CLINIC_OR_DEPARTMENT_OTHER): Payer: Self-pay | Admitting: Emergency Medicine

## 2024-01-15 ENCOUNTER — Other Ambulatory Visit: Payer: Self-pay

## 2024-01-15 ENCOUNTER — Emergency Department (HOSPITAL_BASED_OUTPATIENT_CLINIC_OR_DEPARTMENT_OTHER)
Admission: EM | Admit: 2024-01-15 | Discharge: 2024-01-16 | Disposition: A | Discharge status: Home / Self Care | Payer: BC Managed Care – PPO | Attending: Emergency Medicine | Admitting: Emergency Medicine

## 2024-01-15 ENCOUNTER — Emergency Department (HOSPITAL_BASED_OUTPATIENT_CLINIC_OR_DEPARTMENT_OTHER): Payer: BC Managed Care – PPO

## 2024-01-15 DIAGNOSIS — J101 Influenza due to other identified influenza virus with other respiratory manifestations: Secondary | ICD-10-CM | POA: Insufficient documentation

## 2024-01-15 DIAGNOSIS — R509 Fever, unspecified: Secondary | ICD-10-CM | POA: Diagnosis present

## 2024-01-15 DIAGNOSIS — Z20822 Contact with and (suspected) exposure to covid-19: Secondary | ICD-10-CM | POA: Diagnosis not present

## 2024-01-15 LAB — RESP PANEL BY RT-PCR (RSV, FLU A&B, COVID)  RVPGX2
Influenza A by PCR: POSITIVE — AB
Influenza B by PCR: NEGATIVE
Resp Syncytial Virus by PCR: NEGATIVE
SARS Coronavirus 2 by RT PCR: NEGATIVE

## 2024-01-15 LAB — CBC WITH DIFFERENTIAL/PLATELET
Abs Immature Granulocytes: 0.06 10*3/uL (ref 0.00–0.07)
Basophils Absolute: 0 10*3/uL (ref 0.0–0.1)
Basophils Relative: 0 %
Eosinophils Absolute: 0 10*3/uL (ref 0.0–0.5)
Eosinophils Relative: 0 %
HCT: 38.4 % (ref 36.0–46.0)
Hemoglobin: 12.8 g/dL (ref 12.0–15.0)
Immature Granulocytes: 1 %
Lymphocytes Relative: 5 %
Lymphs Abs: 0.5 10*3/uL — ABNORMAL LOW (ref 0.7–4.0)
MCH: 27.5 pg (ref 26.0–34.0)
MCHC: 33.3 g/dL (ref 30.0–36.0)
MCV: 82.4 fL (ref 80.0–100.0)
Monocytes Absolute: 1.1 10*3/uL — ABNORMAL HIGH (ref 0.1–1.0)
Monocytes Relative: 11 %
Neutro Abs: 7.7 10*3/uL (ref 1.7–7.7)
Neutrophils Relative %: 83 %
Platelets: 314 10*3/uL (ref 150–400)
RBC: 4.66 MIL/uL (ref 3.87–5.11)
RDW: 13.7 % (ref 11.5–15.5)
WBC: 9.4 10*3/uL (ref 4.0–10.5)
nRBC: 0 % (ref 0.0–0.2)

## 2024-01-15 LAB — COMPREHENSIVE METABOLIC PANEL
ALT: 17 U/L (ref 0–44)
AST: 19 U/L (ref 15–41)
Albumin: 3.7 g/dL (ref 3.5–5.0)
Alkaline Phosphatase: 37 U/L — ABNORMAL LOW (ref 38–126)
Anion gap: 10 (ref 5–15)
BUN: 6 mg/dL (ref 6–20)
CO2: 24 mmol/L (ref 22–32)
Calcium: 8.9 mg/dL (ref 8.9–10.3)
Chloride: 102 mmol/L (ref 98–111)
Creatinine, Ser: 0.92 mg/dL (ref 0.44–1.00)
GFR, Estimated: >60 mL/min (ref >60)
Glucose, Bld: 104 mg/dL — ABNORMAL HIGH (ref 70–99)
Potassium: 3.3 mmol/L — ABNORMAL LOW (ref 3.5–5.1)
Sodium: 136 mmol/L (ref 135–145)
Total Bilirubin: 0.7 mg/dL (ref 0.0–1.2)
Total Protein: 7.4 g/dL (ref 6.5–8.1)

## 2024-01-15 LAB — PROTIME-INR
INR: 1 (ref 0.8–1.2)
Prothrombin Time: 13.5 s (ref 11.4–15.2)

## 2024-01-15 LAB — TROPONIN I (HIGH SENSITIVITY): Troponin I (High Sensitivity): <2 ng/L (ref <18)

## 2024-01-15 LAB — HCG, SERUM, QUALITATIVE: Preg, Serum: NEGATIVE

## 2024-01-15 LAB — APTT: aPTT: 26 s (ref 24–36)

## 2024-01-15 LAB — LACTIC ACID, PLASMA: Lactic Acid, Venous: 0.9 mmol/L (ref 0.5–1.9)

## 2024-01-15 LAB — LIPASE, BLOOD: Lipase: 24 U/L (ref 11–51)

## 2024-01-15 MED ORDER — KETOROLAC TROMETHAMINE 15 MG/ML IJ SOLN
15.0000 mg | Freq: Once | INTRAMUSCULAR | Status: AC
  Administered 2024-01-15: 15 mg via INTRAVENOUS
  Filled 2024-01-15: qty 1

## 2024-01-15 MED ORDER — ACETAMINOPHEN 500 MG PO TABS
1000.0000 mg | ORAL_TABLET | Freq: Once | ORAL | Status: AC
  Administered 2024-01-15: 1000 mg via ORAL
  Filled 2024-01-15: qty 2

## 2024-01-15 MED ORDER — SODIUM CHLORIDE 0.9 % IV BOLUS
1000.0000 mL | Freq: Once | INTRAVENOUS | Status: AC
  Administered 2024-01-15: 1000 mL via INTRAVENOUS

## 2024-01-15 MED ORDER — POTASSIUM CHLORIDE CRYS ER 20 MEQ PO TBCR
40.0000 meq | EXTENDED_RELEASE_TABLET | Freq: Once | ORAL | Status: AC
  Administered 2024-01-15: 40 meq via ORAL
  Filled 2024-01-15: qty 2

## 2024-01-15 MED ORDER — LACTATED RINGERS IV BOLUS
1000.0000 mL | Freq: Once | INTRAVENOUS | Status: AC
  Administered 2024-01-15: 1000 mL via INTRAVENOUS

## 2024-01-15 NOTE — ED Provider Notes (Signed)
[facility] Provider Note   CSN: [id number] Arrival date & time: 01/15/24  2109     History  Chief Complaint  Patient presents with   Chest Pain    Lauren Drake is a 43 y.o. female.  HPI 43 year old female with no significant past medical history presents with fever.  She got back from [place] about a week and a half ago.  8 days ago she started feel like there was something in her throat and then progressed to cough and some vomiting and an overall illness.  Illness seem to be getting better and so she went back out of her house a few days ago.  However now starting to get worse and today developed more cough, fever, headache.  She is having pain in her upper abdomen when she coughs.  Home Medications Prior to Admission medications   Medication Sig Start Date End Date Taking? Authorizing Provider  acetaminophen (TYLENOL) 500 MG tablet Take 1 tablet (500 mg total) by mouth every 6 (six) hours as needed. 11/22/18   Law, Waylan Boga, PA-C  benzonatate (TESSALON) 100 MG capsule Take 1 capsule (100 mg total) by mouth every 8 (eight) hours. 11/22/18   Law, Waylan Boga, PA-C  cetirizine-pseudoephedrine (ZYRTEC-D) 5-120 MG tablet Take 1 tablet by mouth 2 (two) times daily. 11/22/18   Law, Waylan Boga, PA-C  cyclobenzaprine (FLEXERIL) 10 MG tablet Take 1 tablet (10 mg total) by mouth 2 (two) times daily as needed for muscle spasms. 05/01/17   Alvira Monday, MD  diclofenac (VOLTAREN) 75 MG EC tablet Take 1 tablet (75 mg total) by mouth 2 (two) times daily. 11/06/20   Elson Areas, PA-C  diphenhydrAMINE (BENADRYL) 25 MG tablet Take 1 tablet (25 mg total) by mouth every 6 (six) hours as needed for itching (Rash). 03/27/16   Everlene Farrier, PA-C  Doxylamine-Pyridoxine 10-10 MG TBEC Take 1 tablet by mouth 2 times daily at 12 noon and 4 pm. Patient not taking: Reported on 04/14/2015 04/14/15   Palumbo, April, MD  ibuprofen (ADVIL,MOTRIN) 600 MG tablet Take  1 tablet (600 mg total) by mouth every 6 (six) hours as needed. 11/22/18   Law, Waylan Boga, PA-C  ondansetron (ZOFRAN) 4 MG tablet Take 1 tablet (4 mg total) by mouth every 6 (six) hours. 11/22/18   Law, Waylan Boga, PA-C  oseltamivir (TAMIFLU) 75 MG capsule Take 1 capsule (75 mg total) by mouth every 12 (twelve) hours. 02/24/18   Tegeler, Canary Brim, MD  predniSONE (DELTASONE) 20 MG tablet Take 2 tablets (40 mg total) by mouth daily. 03/27/16   Everlene Farrier, PA-C  Prenatal Vit-Fe Fumarate-FA (PRENATAL MULTIVITAMIN) TABS tablet Take 1 tablet by mouth daily at 12 noon.    [provider]  promethazine (PHENERGAN) 25 MG tablet Take 0.5-1 tablets (12.5-25 mg total) by mouth every 6 (six) hours as needed. 04/14/15   Armando Reichert, CNM  ranitidine (ZANTAC) 150 MG capsule Take 1 capsule (150 mg total) by mouth daily. 03/27/16   Everlene Farrier, PA-C      Allergies    Metronidazole    Review of Systems   Review of Systems  Constitutional:  Positive for fever.  HENT:  Negative for sore throat.   Respiratory:  Positive for cough. Negative for shortness of breath.   Cardiovascular:  Positive for chest pain.  Gastrointestinal:  Positive for abdominal pain and vomiting.  Neurological:  Positive for headaches.    Physical Exam Updated Vital Signs BP  106/76   Pulse (!) 108   Temp (!) 102.2 F (39 C)   Resp 19   Ht 5\' 3"  (1.6 m)   LMP 01/06/2024   SpO2 97%   BMI 28.34 kg/m  Physical Exam Vitals and nursing note reviewed.  Constitutional:      Appearance: She is well-developed. She is not diaphoretic.  HENT:     Head: Normocephalic and atraumatic.  Cardiovascular:     Rate and Rhythm: Regular rhythm. Tachycardia present.     Heart sounds: Normal heart sounds.  Pulmonary:     Effort: Pulmonary effort is normal.     Breath sounds: Examination of the right-lower field reveals decreased breath sounds. Examination of the left-lower field reveals decreased breath sounds. Decreased  breath sounds present.     Comments: Decreased breath sounds at bases Abdominal:     Palpations: Abdomen is soft.     Tenderness: There is no abdominal tenderness.  Musculoskeletal:     Cervical back: Normal range of motion. No rigidity.  Skin:    General: Skin is warm and dry.  Neurological:     Mental Status: She is alert.     ED Results / Procedures / Treatments   Labs (all labs ordered are listed, but only abnormal results are displayed) Labs Reviewed  RESP PANEL BY RT-PCR (RSV, FLU A&B, COVID)  RVPGX2 - Abnormal; Notable for the following components:      Result Value   Influenza A by PCR POSITIVE (*)    All other components within normal limits  CBC WITH DIFFERENTIAL/PLATELET - Abnormal; Notable for the following components:   Lymphs Abs 0.5 (*)    Monocytes Absolute 1.1 (*)    All other components within normal limits  COMPREHENSIVE METABOLIC PANEL - Abnormal; Notable for the following components:   Potassium 3.3 (*)    Glucose, Bld 104 (*)    Alkaline Phosphatase 37 (*)    All other components within normal limits  CULTURE, BLOOD (ROUTINE X 2)  CULTURE, BLOOD (ROUTINE X 2)  LACTIC ACID, PLASMA  PROTIME-INR  APTT  LIPASE, BLOOD  HCG, SERUM, QUALITATIVE  URINALYSIS, W/ REFLEX TO CULTURE (INFECTION SUSPECTED)  TROPONIN I (HIGH SENSITIVITY)    EKG EKG Interpretation Date/Time:  Sunday January 15 2024 21:17:36 EST Ventricular Rate:  143 PR Interval:  135 QRS Duration:  55 QT Interval:  324 QTC Calculation: 500 R Axis:   61  Text Interpretation: Sinus tachycardia Ventricular premature complex Aberrant conduction of SV complex(es) Probable left atrial enlargement Borderline T abnormalities, diffuse leads Borderline prolonged QT interval rate is faster, otherwise T waves are similar to 2018 Confirmed by Pricilla Loveless [phone]) on 01/15/2024 9:20:54 PM  Radiology DG Chest Portable 1 View Result Date: 01/15/2024 CLINICAL DATA:  Cough and chest pain EXAM: PORTABLE  CHEST 1 VIEW COMPARISON:  Chest x-ray 11/22/2018 FINDINGS: The heart size and mediastinal contours are within normal limits. Both lungs are clear. The visualized skeletal structures are unremarkable. IMPRESSION: No active disease. Electronically Signed   By: Darliss Cheney M.D.   On: 01/15/2024 22:00    Procedures Procedures    Medications Ordered in ED Medications  acetaminophen (TYLENOL) tablet 1,000 mg (1,000 mg Oral Given 01/15/24 2124)  sodium chloride 0.9 % bolus 1,000 mL (0 mLs Intravenous Stopped 01/15/24 2300)  potassium chloride SA (KLOR-CON M) CR tablet 40 mEq (40 mEq Oral Given 01/15/24 2314)  lactated ringers bolus 1,000 mL (1,000 mLs Intravenous New Bag/Given 01/15/24 2313)  ketorolac (TORADOL) 15  MG/ML injection 15 mg (15 mg Intravenous Given 01/15/24 2315)    ED Course/ Medical Decision Making/ A&P                                 Medical Decision Making Amount and/or Complexity of Data Reviewed Labs: ordered.    Details: Normal WBC. Normal troponin. Flu A positive Radiology: ordered and independent interpretation performed.    Details: No Pneumonia ECG/medicine tests: ordered and independent interpretation performed.    Details: Sinus tachycardia  Risk OTC drugs. Prescription drug management.   Patient's presentation is consistent with the flu.  She is flu a positive which goes along with headache, cough, etc.  She is doing better with some fluids and antipyretics, heart rate is still a little high and she is still having a little bit of a headache so we will give some Toradol and a second bag of fluid.  Assuming she does better, I think she can be discharged with supportive care.  Very low suspicion this is meningitis, encephalitis, or bacterial infection at this time.  I suspect she had some sort of URI and now has developed flu since she developed a fever today.  She has been having chest pain since around noon and with a negative troponin 10+ hours and, as well as a  much more likely explanation with the flu, I think ACS, PE, etc. is all unlikely.  Care transferred to Dr. Nicanor Alcon, second liter and toradol pending.        Final Clinical Impression(s) / ED Diagnoses Final diagnoses:  Influenza A    Rx / DC Orders ED Discharge Orders     None         Pricilla Loveless, MD 01/15/24 [phone]

## 2024-01-15 NOTE — ED Triage Notes (Signed)
Pt c/o cough and CP (middle to epigastric area); returned from [place] on 1/15

## 2024-01-16 DIAGNOSIS — J101 Influenza due to other identified influenza virus with other respiratory manifestations: Secondary | ICD-10-CM | POA: Diagnosis not present

## 2024-01-16 LAB — URINALYSIS, W/ REFLEX TO CULTURE (INFECTION SUSPECTED)
Bilirubin Urine: NEGATIVE
Glucose, UA: NEGATIVE mg/dL
Ketones, ur: 15 mg/dL — AB
Leukocytes,Ua: NEGATIVE
Nitrite: NEGATIVE
Protein, ur: NEGATIVE mg/dL
Specific Gravity, Urine: 1.015 (ref 1.005–1.030)
pH: 6 (ref 5.0–8.0)

## 2024-01-21 LAB — CULTURE, BLOOD (ROUTINE X 2)
Culture: NO GROWTH
Culture: NO GROWTH
